# Patient Record
Sex: Female | Born: 1990 | Race: Black or African American | Hispanic: No | Smoking: Former smoker
Health system: Southern US, Community
[De-identification: ages and names within clinical notes are randomized; demographics above are authoritative.]

## PROBLEM LIST (undated history)

## (undated) DIAGNOSIS — B9689 Other specified bacterial agents as the cause of diseases classified elsewhere: Secondary | ICD-10-CM

## (undated) HISTORY — DX: Other specified bacterial agents as the cause of diseases classified elsewhere: B96.89

## (undated) HISTORY — PX: NO PAST SURGERIES: SHX2092

---

## 2018-05-18 NOTE — L&D Delivery Note (Signed)
[facility] Delivery Note  Cassidy Gutierrez is a 28 y.o. now G2P1011 s/p NSVD at [redacted]w[redacted]d who was admitted for IOL for gHTN.   ROM: 17h 19m with clear fluid GBS Status: Negative Maximum Maternal Temperature: 100.7  Delivery Note At 6:08 PM a viable female was delivered via Vaginal, Spontaneous (Presentation: OA).  APGAR: 8, 9; weight 6 lbs 14.4 oz.  Tight Nuchal cord was reduced before delivery of body. Baby was then somersaulted through a  body cord to achieve full delivery. Placenta status: spontaneous, intact via Delena Bali.  Cord: 3VC with no complications.  Cord pH: n/a  Anesthesia: Epidural Episiotomy: None Lacerations: 2nd degree; Sulcus; Vaginal; Perineal Suture Repair: 2.0 vicryl Est. Blood Loss (mL):  172   Postpartum Planning [x]  Mom to postpartum.  Baby to Couplet care / Skin to Skin.  [x]  plans to breast feed [x]  message to sent to schedule follow-up  [x]  vaccines UTD  Laury Deep, MSN, CNM 04/08/19, 6:52 PM

## 2018-09-19 LAB — OB RESULTS CONSOLE HEPATITIS B SURFACE ANTIGEN
Hepatitis B Surface Ag: NEGATIVE
Hepatitis B Surface Ag: NEGATIVE

## 2018-09-19 LAB — OB RESULTS CONSOLE HIV ANTIBODY (ROUTINE TESTING): HIV: NONREACTIVE

## 2018-09-19 LAB — OB RESULTS CONSOLE ABO/RH: RH Type: POSITIVE

## 2018-09-19 LAB — OB RESULTS CONSOLE GC/CHLAMYDIA
Chlamydia: NEGATIVE
Gonorrhea: NEGATIVE

## 2018-09-19 LAB — OB RESULTS CONSOLE RPR
RPR: NONREACTIVE
RPR: NONREACTIVE

## 2018-09-19 LAB — AMB REFERRAL TO OB-GYN: Lead, Blood: <1

## 2018-09-19 LAB — OB RESULTS CONSOLE PLATELET COUNT: Platelets: 194

## 2018-10-27 LAB — AMB REFERRAL TO OB-GYN: Glucose, 1 hour: 113

## 2018-12-16 ENCOUNTER — Telehealth: Payer: Self-pay | Admitting: Obstetrics & Gynecology

## 2018-12-16 NOTE — Telephone Encounter (Signed)
Called the patient to inform of the upcoming appointment, also informed of how to log into the mychart app to attend. Notified the patient if the provider does not log in within 15 minutes of the appointment please call our office so that we can ensure there are no issues with the connection or if the provider is running behind. The patient verbalized understanding.

## 2018-12-19 ENCOUNTER — Telehealth (INDEPENDENT_AMBULATORY_CARE_PROVIDER_SITE_OTHER): Payer: Medicaid Other | Admitting: *Deleted

## 2018-12-19 ENCOUNTER — Encounter: Payer: Self-pay | Admitting: *Deleted

## 2018-12-19 ENCOUNTER — Other Ambulatory Visit: Payer: Self-pay

## 2018-12-19 DIAGNOSIS — Z3490 Encounter for supervision of normal pregnancy, unspecified, unspecified trimester: Secondary | ICD-10-CM

## 2018-12-19 MED ORDER — BLOOD PRESSURE KIT DEVI: 1.0000 | 0 refills | Status: AC | PRN

## 2018-12-19 NOTE — Progress Notes (Signed)
I connected with  [name] on 12/19/18 at  3:30 PM EDT by telephone and verified that I am speaking with the correct person using two identifiers.   I discussed the limitations, risks, security and privacy concerns of performing an evaluation and management service by telephone and the availability of in person appointments. I also discussed with the patient that there may be a patient responsible charge related to this service. The patient expressed understanding and agreed to proceed. We reviewed her EDD based on [place] from her other prenatal provider in [place]. We have some of her labs , some of her  [place] but not provider notes. She states she had 5 [place].  I asked her to come by the office to sign a release to get the rest of the records. I reviewed her new ob appt  Date/ time/ our location/ to wear mask/ no visitors with her. We discussed she will have some visits in office, some virtual. She has already downloaded MyChart app. I sent her babyscripts app and assisted with downloadind. I explained we will send her a bp cuff and once it arrives to take bp weekly and enter in app.  I also explained she will get any remaining labs needed including hemoglobin A1c Nusrat Encarnacion,RN 12/19/2018  3:23 PM

## 2018-12-20 NOTE — Progress Notes (Signed)
Patient seen and assessed by nursing staff during this encounter. I have reviewed the chart and agree with the documentation and plan.  Verita Schneiders, MD 12/20/2018 12:05 PM

## 2018-12-29 ENCOUNTER — Ambulatory Visit (INDEPENDENT_AMBULATORY_CARE_PROVIDER_SITE_OTHER): Payer: Medicaid Other | Admitting: Obstetrics and Gynecology

## 2018-12-29 ENCOUNTER — Encounter: Payer: Self-pay | Admitting: Obstetrics and Gynecology

## 2018-12-29 ENCOUNTER — Other Ambulatory Visit: Payer: Self-pay

## 2018-12-29 VITALS — BP 128/65 | HR 85 | Wt 224.5 lb

## 2018-12-29 DIAGNOSIS — D573 Sickle-cell trait: Secondary | ICD-10-CM

## 2018-12-29 DIAGNOSIS — Z3490 Encounter for supervision of normal pregnancy, unspecified, unspecified trimester: Secondary | ICD-10-CM

## 2018-12-29 DIAGNOSIS — O0930 Supervision of pregnancy with insufficient antenatal care, unspecified trimester: Secondary | ICD-10-CM | POA: Insufficient documentation

## 2018-12-29 DIAGNOSIS — O99212 Obesity complicating pregnancy, second trimester: Secondary | ICD-10-CM | POA: Diagnosis not present

## 2018-12-29 DIAGNOSIS — Z3A23 23 weeks gestation of pregnancy: Secondary | ICD-10-CM | POA: Diagnosis not present

## 2018-12-29 DIAGNOSIS — O99210 Obesity complicating pregnancy, unspecified trimester: Secondary | ICD-10-CM | POA: Insufficient documentation

## 2018-12-29 DIAGNOSIS — O0932 Supervision of pregnancy with insufficient antenatal care, second trimester: Secondary | ICD-10-CM

## 2018-12-29 MED ORDER — ASPIRIN EC 81 MG PO TBEC: 81.0000 mg | DELAYED_RELEASE_TABLET | Freq: Every day | ORAL | 2 refills | Status: DC

## 2018-12-29 NOTE — Progress Notes (Signed)
  Subjective:    Cassidy Gutierrez is a G2P0010 [redacted]w[redacted]d being seen today for her first obstetrical visit in [place]. Patient is transferring care from [place] with some records. Her obstetrical history is significant for maternal obesity, and sickle cell trait. Patient does intend to breast feed. Pregnancy history fully reviewed.  Patient reports no complaints.  Vitals:   12/29/18 1347  BP: 128/65  Pulse: 85  Weight: 224 lb 8 oz (101.8 kg)    HISTORY: OB History  Gravida Para Term Preterm AB Living  2       1    SAB TAB Ectopic Multiple Live Births    1          # Outcome Date GA Lbr Len/2nd Weight Sex Delivery Anes PTL Lv  2 Current           1 TAB 2016           Past Medical History:  Diagnosis Date  . BV (bacterial vaginosis)    No past surgical history on file. Family History  Problem Relation Age of Onset  . Breast cancer Mother   . Diabetes Mother      Exam    Uterus:     Pelvic Exam:    Perineum: Normal Perineum   Vulva: normal   Vagina:  normal mucosa, normal discharge   pH:    Cervix: nulliparous appearance and cervix is closed and long   Adnexa: not evaluated   Bony Pelvis: gynecoid  System: Breast:  normal appearance, no masses or tenderness   Skin: normal coloration and turgor, no rashes    Neurologic: oriented, no focal deficits   Extremities: normal strength, tone, and muscle mass   HEENT extra ocular movement intact   Mouth/Teeth mucous membranes moist, pharynx normal without lesions and dental hygiene good   Neck supple and no masses   Cardiovascular: regular rate and rhythm   Respiratory:  appears well, vitals normal, no respiratory distress, acyanotic, normal RR, chest clear, no wheezing, crepitations, rhonchi, normal symmetric air entry   Abdomen: soft, gravid   Urinary:       Assessment:    Pregnancy: G2P0010 Patient Active Problem List   Diagnosis Date Noted  . Sickle cell trait ([place]) 12/29/2018  . Insufficient prenatal care 12/29/2018  .  Supervision of low-risk pregnancy 12/19/2018        Plan:     Prenatal vitamins. Problem list reviewed and updated. Prenatal labs from [place] reviewed. Will obtain pap smear record  Ultrasound discussed; fetal survey: ordered.  Follow up in 4 weeks for third trimester labs and glucola Rx ASA provided 50% of 30 min visit spent on counseling and coordination of care.     Kipton Skillen 12/29/2018

## 2018-12-29 NOTE — Progress Notes (Signed)
Anatomy US scheduled for August 21st @ 1430.  Pt notified.

## 2018-12-29 NOTE — Patient Instructions (Signed)
 Second Trimester of Pregnancy The second trimester is from week 14 through week 27 (months 4 through 6). The second trimester is often a time when you feel your best. Your body has adjusted to being pregnant, and you begin to feel better physically. Usually, morning sickness has lessened or quit completely, you may have more energy, and you may have an increase in appetite. The second trimester is also a time when the fetus is growing rapidly. At the end of the sixth month, the fetus is about 9 inches long and weighs about 1 pounds. You will likely begin to feel the baby move (quickening) between 16 and 20 weeks of pregnancy. Body changes during your second trimester Your body continues to go through many changes during your second trimester. The changes vary from woman to woman.  Your weight will continue to increase. You will notice your lower abdomen bulging out.  You may begin to get stretch marks on your hips, abdomen, and breasts.  You may develop headaches that can be relieved by medicines. The medicines should be approved by your health care provider.  You may urinate more often because the fetus is pressing on your bladder.  You may develop or continue to have heartburn as a result of your pregnancy.  You may develop constipation because certain hormones are causing the muscles that push waste through your intestines to slow down.  You may develop hemorrhoids or swollen, bulging veins (varicose veins).  You may have back pain. This is caused by: ? Weight gain. ? Pregnancy hormones that are relaxing the joints in your pelvis. ? A shift in weight and the muscles that support your balance.  Your breasts will continue to grow and they will continue to become tender.  Your gums may bleed and may be sensitive to brushing and flossing.  Dark spots or blotches (chloasma, mask of pregnancy) may develop on your face. This will likely fade after the baby is born.  A dark line from  your belly button to the pubic area (linea nigra) may appear. This will likely fade after the baby is born.  You may have changes in your hair. These can include thickening of your hair, rapid growth, and changes in texture. Some women also have hair loss during or after pregnancy, or hair that feels dry or thin. Your hair will most likely return to normal after your baby is born. What to expect at prenatal visits During a routine prenatal visit:  You will be weighed to make sure you and the fetus are growing normally.  Your blood pressure will be taken.  Your abdomen will be measured to track your baby's growth.  The fetal heartbeat will be listened to.  Any test results from the previous visit will be discussed. Your health care provider may ask you:  How you are feeling.  If you are feeling the baby move.  If you have had any abnormal symptoms, such as leaking fluid, bleeding, severe headaches, or abdominal cramping.  If you are using any tobacco products, including cigarettes, chewing tobacco, and electronic cigarettes.  If you have any questions. Other tests that may be performed during your second trimester include:  Blood tests that check for: ? Low iron levels (anemia). ? High blood sugar that affects pregnant women (gestational diabetes) between 24 and 28 weeks. ? Rh antibodies. This is to check for a protein on red blood cells (Rh factor).  Urine tests to check for infections, diabetes, or protein in   the urine.  An ultrasound to confirm the proper growth and development of the baby.  An amniocentesis to check for possible genetic problems.  Fetal screens for spina bifida and Down syndrome.  HIV (human immunodeficiency virus) testing. Routine prenatal testing includes screening for HIV, unless you choose not to have this test. Follow these instructions at home: Medicines  Follow your health care provider's instructions regarding medicine use. Specific medicines  may be either safe or unsafe to take during pregnancy.  Take a prenatal vitamin that contains at least 600 micrograms (mcg) of folic acid.  If you develop constipation, try taking a stool softener if your health care provider approves. Eating and drinking   Eat a balanced diet that includes fresh fruits and vegetables, whole grains, good sources of protein such as meat, eggs, or tofu, and low-fat dairy. Your health care provider will help you determine the amount of weight gain that is right for you.  Avoid raw meat and uncooked cheese. These carry germs that can cause birth defects in the baby.  If you have low calcium intake from food, talk to your health care provider about whether you should take a daily calcium supplement.  Limit foods that are high in fat and processed sugars, such as fried and sweet foods.  To prevent constipation: ? Drink enough fluid to keep your urine clear or pale yellow. ? Eat foods that are high in fiber, such as fresh fruits and vegetables, whole grains, and beans. Activity  Exercise only as directed by your health care provider. Most women can continue their usual exercise routine during pregnancy. Try to exercise for 30 minutes at least 5 days a week. Stop exercising if you experience uterine contractions.  Avoid heavy lifting, wear low heel shoes, and practice good posture.  A sexual relationship may be continued unless your health care provider directs you otherwise. Relieving pain and discomfort  Wear a good support bra to prevent discomfort from breast tenderness.  Take warm sitz baths to soothe any pain or discomfort caused by hemorrhoids. Use hemorrhoid cream if your health care provider approves.  Rest with your legs elevated if you have leg cramps or low back pain.  If you develop varicose veins, wear support hose. Elevate your feet for 15 minutes, 3-4 times a day. Limit salt in your diet. Prenatal Care  Write down your questions. Take  them to your prenatal visits.  Keep all your prenatal visits as told by your health care provider. This is important. Safety  Wear your seat belt at all times when driving.  Make a list of emergency phone numbers, including numbers for family, friends, the hospital, and police and fire departments. General instructions  Ask your health care provider for a referral to a local prenatal education class. Begin classes no later than the beginning of month 6 of your pregnancy.  Ask for help if you have counseling or nutritional needs during pregnancy. Your health care provider can offer advice or refer you to specialists for help with various needs.  Do not use hot tubs, steam rooms, or saunas.  Do not douche or use tampons or scented sanitary pads.  Do not cross your legs for long periods of time.  Avoid cat litter boxes and soil used by cats. These carry germs that can cause birth defects in the baby and possibly loss of the fetus by miscarriage or stillbirth.  Avoid all smoking, herbs, alcohol, and unprescribed drugs. Chemicals in these products can affect the   formation and growth of the baby.  Do not use any products that contain nicotine or tobacco, such as cigarettes and e-cigarettes. If you need help quitting, ask your health care provider.  Visit your dentist if you have not gone yet during your pregnancy. Use a soft toothbrush to brush your teeth and be gentle when you floss. Contact a health care provider if:  You have dizziness.  You have mild pelvic cramps, pelvic pressure, or nagging pain in the abdominal area.  You have persistent nausea, vomiting, or diarrhea.  You have a bad smelling vaginal discharge.  You have pain when you urinate. Get help right away if:  You have a fever.  You are leaking fluid from your vagina.  You have spotting or bleeding from your vagina.  You have severe abdominal cramping or pain.  You have rapid weight gain or weight loss.  You  have shortness of breath with chest pain.  You notice sudden or extreme swelling of your face, hands, ankles, feet, or legs.  You have not felt your baby move in over an hour.  You have severe headaches that do not go away when you take medicine.  You have vision changes. Summary  The second trimester is from week 14 through week 27 (months 4 through 6). It is also a time when the fetus is growing rapidly.  Your body goes through many changes during pregnancy. The changes vary from woman to woman.  Avoid all smoking, herbs, alcohol, and unprescribed drugs. These chemicals affect the formation and growth your baby.  Do not use any tobacco products, such as cigarettes, chewing tobacco, and e-cigarettes. If you need help quitting, ask your health care provider.  Contact your health care provider if you have any questions. Keep all prenatal visits as told by your health care provider. This is important. This information is not intended to replace advice given to you by your health care provider. Make sure you discuss any questions you have with your health care provider. Document Released: 04/28/2001 Document Revised: 08/26/2018 Document Reviewed: 06/09/2016 Elsevier Patient Education  2020 [employer].   Third Trimester of Pregnancy The third trimester is from week 28 through week 40 (months 7 through 9). The third trimester is a time when the unborn baby (fetus) is growing rapidly. At the end of the ninth month, the fetus is about 20 inches in length and weighs 6-10 pounds. Body changes during your third trimester Your body will continue to go through many changes during pregnancy. The changes vary from woman to woman. During the third trimester:  Your weight will continue to increase. You can expect to gain 25-35 pounds (11-16 kg) by the end of the pregnancy.  You may begin to get stretch marks on your hips, abdomen, and breasts.  You may urinate more often because the fetus is  moving lower into your pelvis and pressing on your bladder.  You may develop or continue to have heartburn. This is caused by increased hormones that slow down muscles in the digestive tract.  You may develop or continue to have constipation because increased hormones slow digestion and cause the muscles that push waste through your intestines to relax.  You may develop hemorrhoids. These are swollen veins (varicose veins) in the rectum that can itch or be painful.  You may develop swollen, bulging veins (varicose veins) in your legs.  You may have increased body aches in the pelvis, back, or thighs. This is due to weight gain  and increased hormones that are relaxing your joints.  You may have changes in your hair. These can include thickening of your hair, rapid growth, and changes in texture. Some women also have hair loss during or after pregnancy, or hair that feels dry or thin. Your hair will most likely return to normal after your baby is born.  Your breasts will continue to grow and they will continue to become tender. A yellow fluid (colostrum) may leak from your breasts. This is the first milk you are producing for your baby.  Your belly button may stick out.  You may notice more swelling in your hands, face, or ankles.  You may have increased tingling or numbness in your hands, arms, and legs. The skin on your belly may also feel numb.  You may feel short of breath because of your expanding uterus.  You may have more problems sleeping. This can be caused by the size of your belly, increased need to urinate, and an increase in your body's metabolism.  You may notice the fetus "dropping," or moving lower in your abdomen (lightening).  You may have increased vaginal discharge.  You may notice your joints feel loose and you may have pain around your pelvic bone. What to expect at prenatal visits You will have prenatal exams every 2 weeks until week 36. Then you will have weekly  prenatal exams. During a routine prenatal visit:  You will be weighed to make sure you and the baby are growing normally.  Your blood pressure will be taken.  Your abdomen will be measured to track your baby's growth.  The fetal heartbeat will be listened to.  Any test results from the previous visit will be discussed.  You may have a cervical check near your due date to see if your cervix has softened or thinned (effaced).  You will be tested for Group B streptococcus. This happens between 35 and 37 weeks. Your health care provider may ask you:  What your birth plan is.  How you are feeling.  If you are feeling the baby move.  If you have had any abnormal symptoms, such as leaking fluid, bleeding, severe headaches, or abdominal cramping.  If you are using any tobacco products, including cigarettes, chewing tobacco, and electronic cigarettes.  If you have any questions. Other tests or screenings that may be performed during your third trimester include:  Blood tests that check for low iron levels (anemia).  Fetal testing to check the health, activity level, and growth of the fetus. Testing is done if you have certain medical conditions or if there are problems during the pregnancy.  Nonstress test (NST). This test checks the health of your baby to make sure there are no signs of problems, such as the baby not getting enough oxygen. During this test, a belt is placed around your belly. The baby is made to move, and its heart rate is monitored during movement. What is false labor? False labor is a condition in which you feel small, irregular tightenings of the muscles in the womb (contractions) that usually go away with rest, changing position, or drinking water. These are called Braxton Hicks contractions. Contractions may last for hours, days, or even weeks before true labor sets in. If contractions come at regular intervals, become more frequent, increase in intensity, or become  painful, you should see your health care provider. What are the signs of labor?  Abdominal cramps.  Regular contractions that start at 10 minutes apart and  become stronger and more frequent with time.  Contractions that start on the top of the uterus and spread down to the lower abdomen and back.  Increased pelvic pressure and dull back pain.  A watery or bloody mucus discharge that comes from the vagina.  Leaking of amniotic fluid. This is also known as your "water breaking." It could be a slow trickle or a gush. Let your health care provider know if it has a color or strange odor. If you have any of these signs, call your health care provider right away, even if it is before your due date. Follow these instructions at home: Medicines  Follow your health care provider's instructions regarding medicine use. Specific medicines may be either safe or unsafe to take during pregnancy.  Take a prenatal vitamin that contains at least 600 micrograms (mcg) of folic acid.  If you develop constipation, try taking a stool softener if your health care provider approves. Eating and drinking   Eat a balanced diet that includes fresh fruits and vegetables, whole grains, good sources of protein such as meat, eggs, or tofu, and low-fat dairy. Your health care provider will help you determine the amount of weight gain that is right for you.  Avoid raw meat and uncooked cheese. These carry germs that can cause birth defects in the baby.  If you have low calcium intake from food, talk to your health care provider about whether you should take a daily calcium supplement.  Eat four or five small meals rather than three large meals a day.  Limit foods that are high in fat and processed sugars, such as fried and sweet foods.  To prevent constipation: ? Drink enough fluid to keep your urine clear or pale yellow. ? Eat foods that are high in fiber, such as fresh fruits and vegetables, whole grains, and  beans. Activity  Exercise only as directed by your health care provider. Most women can continue their usual exercise routine during pregnancy. Try to exercise for 30 minutes at least 5 days a week. Stop exercising if you experience uterine contractions.  Avoid heavy lifting.  Do not exercise in extreme heat or humidity, or at high altitudes.  Wear low-heel, comfortable shoes.  Practice good posture.  You may continue to have sex unless your health care provider tells you otherwise. Relieving pain and discomfort  Take frequent breaks and rest with your legs elevated if you have leg cramps or low back pain.  Take warm sitz baths to soothe any pain or discomfort caused by hemorrhoids. Use hemorrhoid cream if your health care provider approves.  Wear a good support bra to prevent discomfort from breast tenderness.  If you develop varicose veins: ? Wear support pantyhose or compression stockings as told by your healthcare provider. ? Elevate your feet for 15 minutes, 3-4 times a day. Prenatal care  Write down your questions. Take them to your prenatal visits.  Keep all your prenatal visits as told by your health care provider. This is important. Safety  Wear your seat belt at all times when driving.  Make a list of emergency phone numbers, including numbers for family, friends, the hospital, and police and fire departments. General instructions  Avoid cat litter boxes and soil used by cats. These carry germs that can cause birth defects in the baby. If you have a cat, ask someone to clean the litter box for you.  Do not travel far distances unless it is absolutely necessary and only with the  approval of your health care provider.  Do not use hot tubs, steam rooms, or saunas.  Do not drink alcohol.  Do not use any products that contain nicotine or tobacco, such as cigarettes and e-cigarettes. If you need help quitting, ask your health care provider.  Do not use any medicinal  herbs or unprescribed drugs. These chemicals affect the formation and growth of the baby.  Do not douche or use tampons or scented sanitary pads.  Do not cross your legs for long periods of time.  To prepare for the arrival of your baby: ? Take prenatal classes to understand, practice, and ask questions about labor and delivery. ? Make a trial run to the hospital. ? Visit the hospital and tour the maternity area. ? Arrange for maternity or paternity leave through employers. ? Arrange for family and friends to take care of pets while you are in the hospital. ? Purchase a rear-facing car seat and make sure you know how to install it in your car. ? Pack your hospital bag. ? Prepare the baby's nursery. Make sure to remove all pillows and stuffed animals from the baby's crib to prevent suffocation.  Visit your dentist if you have not gone during your pregnancy. Use a soft toothbrush to brush your teeth and be gentle when you floss. Contact a health care provider if:  You are unsure if you are in labor or if your water has broken.  You become dizzy.  You have mild pelvic cramps, pelvic pressure, or nagging pain in your abdominal area.  You have lower back pain.  You have persistent nausea, vomiting, or diarrhea.  You have an unusual or bad smelling vaginal discharge.  You have pain when you urinate. Get help right away if:  Your water breaks before 37 weeks.  You have regular contractions less than 5 minutes apart before 37 weeks.  You have a fever.  You are leaking fluid from your vagina.  You have spotting or bleeding from your vagina.  You have severe abdominal pain or cramping.  You have rapid weight loss or weight gain.  You have shortness of breath with chest pain.  You notice sudden or extreme swelling of your face, hands, ankles, feet, or legs.  Your baby makes fewer than 10 movements in 2 hours.  You have severe headaches that do not go away when you take  medicine.  You have vision changes. Summary  The third trimester is from week 28 through week 40, months 7 through 9. The third trimester is a time when the unborn baby (fetus) is growing rapidly.  During the third trimester, your discomfort may increase as you and your baby continue to gain weight. You may have abdominal, leg, and back pain, sleeping problems, and an increased need to urinate.  During the third trimester your breasts will keep growing and they will continue to become tender. A yellow fluid (colostrum) may leak from your breasts. This is the first milk you are producing for your baby.  False labor is a condition in which you feel small, irregular tightenings of the muscles in the womb (contractions) that eventually go away. These are called Braxton Hicks contractions. Contractions may last for hours, days, or even weeks before true labor sets in.  Signs of labor can include: abdominal cramps; regular contractions that start at 10 minutes apart and become stronger and more frequent with time; watery or bloody mucus discharge that comes from the vagina; increased pelvic pressure and dull  back pain; and leaking of amniotic fluid. This information is not intended to replace advice given to you by your health care provider. Make sure you discuss any questions you have with your health care provider. Document Released: 04/28/2001 Document Revised: 08/25/2018 Document Reviewed: 06/09/2016 Elsevier Patient Education  2020 [employer]   Contraception Choices Contraception, also called birth control, refers to methods or devices that prevent pregnancy. Hormonal methods Contraceptive implant  A contraceptive implant is a thin, plastic tube that contains a hormone. It is inserted into the upper part of the arm. It can remain in place for up to 3 years. Progestin-only injections Progestin-only injections are injections of progestin, a synthetic form of the hormone progesterone. They  are given every 3 months by a health care provider. Birth control pills  Birth control pills are pills that contain hormones that prevent pregnancy. They must be taken once a day, preferably at the same time each day. Birth control patch  The birth control patch contains hormones that prevent pregnancy. It is placed on the skin and must be changed once a week for three weeks and removed on the fourth week. A prescription is needed to use this method of contraception. Vaginal ring  A vaginal ring contains hormones that prevent pregnancy. It is placed in the vagina for three weeks and removed on the fourth week. After that, the process is repeated with a new ring. A prescription is needed to use this method of contraception. Emergency contraceptive Emergency contraceptives prevent pregnancy after unprotected sex. They come in pill form and can be taken up to 5 days after sex. They work best the sooner they are taken after having sex. Most emergency contraceptives are available without a prescription. This method should not be used as your only form of birth control. Barrier methods Female condom  A female condom is a thin sheath that is worn over the penis during sex. Condoms keep sperm from going inside a woman's body. They can be used with a spermicide to increase their effectiveness. They should be disposed after a single use. Female condom  A female condom is a soft, loose-fitting sheath that is put into the vagina before sex. The condom keeps sperm from going inside a woman's body. They should be disposed after a single use. Diaphragm  A diaphragm is a soft, dome-shaped barrier. It is inserted into the vagina before sex, along with a spermicide. The diaphragm blocks sperm from entering the uterus, and the spermicide kills sperm. A diaphragm should be left in the vagina for 6-8 hours after sex and removed within 24 hours. A diaphragm is prescribed and fitted by a health care provider. A diaphragm  should be replaced every 1-2 years, after giving birth, after gaining more than 15 lb (6.8 kg), and after pelvic surgery. Cervical cap  A cervical cap is a round, soft latex or plastic cup that fits over the cervix. It is inserted into the vagina before sex, along with spermicide. It blocks sperm from entering the uterus. The cap should be left in place for 6-8 hours after sex and removed within 48 hours. A cervical cap must be prescribed and fitted by a health care provider. It should be replaced every 2 years. Sponge  A sponge is a soft, circular piece of polyurethane foam with spermicide on it. The sponge helps block sperm from entering the uterus, and the spermicide kills sperm. To use it, you make it wet and then insert it into the vagina. It  should be inserted before sex, left in for at least 6 hours after sex, and removed and thrown away within 30 hours. Spermicides Spermicides are chemicals that kill or block sperm from entering the cervix and uterus. They can come as a cream, jelly, suppository, foam, or tablet. A spermicide should be inserted into the vagina with an applicator at least 10-15 minutes before sex to allow time for it to work. The process must be repeated every time you have sex. Spermicides do not require a prescription. Intrauterine contraception Intrauterine device (IUD) An IUD is a T-shaped device that is put in a woman's uterus. There are two types:  Hormone IUD.This type contains progestin, a synthetic form of the hormone progesterone. This type can stay in place for 3-5 years.  Copper IUD.This type is wrapped in copper wire. It can stay in place for 10 years.  Permanent methods of contraception Female tubal ligation In this method, a woman's fallopian tubes are sealed, tied, or blocked during surgery to prevent eggs from traveling to the uterus. Hysteroscopic sterilization In this method, a small, flexible insert is placed into each fallopian tube. The inserts cause  scar tissue to form in the fallopian tubes and block them, so sperm cannot reach an egg. The procedure takes about 3 months to be effective. Another form of birth control must be used during those 3 months. Female sterilization This is a procedure to tie off the tubes that carry sperm (vasectomy). After the procedure, the man can still ejaculate fluid (semen). Natural planning methods Natural family planning In this method, a couple does not have sex on days when the woman could become pregnant. Calendar method This means keeping track of the length of each menstrual cycle, identifying the days when pregnancy can happen, and not having sex on those days. Ovulation method In this method, a couple avoids sex during ovulation. Symptothermal method This method involves not having sex during ovulation. The woman typically checks for ovulation by watching changes in her temperature and in the consistency of cervical mucus. Post-ovulation method In this method, a couple waits to have sex until after ovulation. Summary  Contraception, also called birth control, means methods or devices that prevent pregnancy.  Hormonal methods of contraception include implants, injections, pills, patches, vaginal rings, and emergency contraceptives.  Barrier methods of contraception can include female condoms, female condoms, diaphragms, cervical caps, sponges, and spermicides.  There are two types of IUDs (intrauterine devices). An IUD can be put in a woman's uterus to prevent pregnancy for 3-5 years.  Permanent sterilization can be done through a procedure for males, females, or both.  Natural family planning methods involve not having sex on days when the woman could become pregnant. This information is not intended to replace advice given to you by your health care provider. Make sure you discuss any questions you have with your health care provider. Document Released: 05/04/2005 Document Revised: 05/06/2017  Document Reviewed: 06/06/2016 Elsevier Patient Education  2020 [employer].   Breastfeeding  Choosing to breastfeed is one of the best decisions you can make for yourself and your baby. A change in hormones during pregnancy causes your breasts to make breast milk in your milk-producing glands. Hormones prevent breast milk from being released before your baby is born. They also prompt milk flow after birth. Once breastfeeding has begun, thoughts of your baby, as well as his or her sucking or crying, can stimulate the release of milk from your milk-producing glands. Benefits of breastfeeding Research  shows that breastfeeding offers many health benefits for infants and mothers. It also offers a cost-free and convenient way to feed your baby. For your baby  Your first milk (colostrum) helps your baby's digestive system to function better.  Special cells in your milk (antibodies) help your baby to fight off infections.  Breastfed babies are less likely to develop asthma, allergies, obesity, or type 2 diabetes. They are also at lower risk for sudden infant death syndrome (SIDS).  Nutrients in breast milk are better able to meet your baby's needs compared to infant formula.  Breast milk improves your baby's brain development. For you  Breastfeeding helps to create a very special bond between you and your baby.  Breastfeeding is convenient. Breast milk costs nothing and is always available at the correct temperature.  Breastfeeding helps to burn calories. It helps you to lose the weight that you gained during pregnancy.  Breastfeeding makes your uterus return faster to its size before pregnancy. It also slows bleeding (lochia) after you give birth.  Breastfeeding helps to lower your risk of developing type 2 diabetes, osteoporosis, rheumatoid arthritis, cardiovascular disease, and breast, ovarian, uterine, and endometrial cancer later in life. Breastfeeding basics Starting breastfeeding   Find a comfortable place to sit or lie down, with your neck and back well-supported.  Place a pillow or a rolled-up blanket under your baby to bring him or her to the level of your breast (if you are seated). Nursing pillows are specially designed to help support your arms and your baby while you breastfeed.  Make sure that your baby's tummy (abdomen) is facing your abdomen.  Gently massage your breast. With your fingertips, massage from the outer edges of your breast inward toward the nipple. This encourages milk flow. If your milk flows slowly, you may need to continue this action during the feeding.  Support your breast with 4 fingers underneath and your thumb above your nipple (make the letter "C" with your hand). Make sure your fingers are well away from your nipple and your baby's mouth.  Stroke your baby's lips gently with your finger or nipple.  When your baby's mouth is open wide enough, quickly bring your baby to your breast, placing your entire nipple and as much of the areola as possible into your baby's mouth. The areola is the colored area around your nipple. ? More areola should be visible above your baby's upper lip than below the lower lip. ? Your baby's lips should be opened and extended outward (flanged) to ensure an adequate, comfortable latch. ? Your baby's tongue should be between his or her lower gum and your breast.  Make sure that your baby's mouth is correctly positioned around your nipple (latched). Your baby's lips should create a seal on your breast and be turned out (everted).  It is common for your baby to suck about 2-3 minutes in order to start the flow of breast milk. Latching Teaching your baby how to latch onto your breast properly is very important. An improper latch can cause nipple pain, decreased milk supply, and poor weight gain in your baby. Also, if your baby is not latched onto your nipple properly, he or she may swallow some air during feeding. This  can make your baby fussy. Burping your baby when you switch breasts during the feeding can help to get rid of the air. However, teaching your baby to latch on properly is still the best way to prevent fussiness from swallowing air while breastfeeding. Signs  that your baby has successfully latched onto your nipple  Silent tugging or silent sucking, without causing you pain. Infant's lips should be extended outward (flanged).  Swallowing heard between every 3-4 sucks once your milk has started to flow (after your let-down milk reflex occurs).  Muscle movement above and in front of his or her ears while sucking. Signs that your baby has not successfully latched onto your nipple  Sucking sounds or smacking sounds from your baby while breastfeeding.  Nipple pain. If you think your baby has not latched on correctly, slip your finger into the corner of your baby's mouth to break the suction and place it between your baby's gums. Attempt to start breastfeeding again. Signs of successful breastfeeding Signs from your baby  Your baby will gradually decrease the number of sucks or will completely stop sucking.  Your baby will fall asleep.  Your baby's body will relax.  Your baby will retain a small amount of milk in his or her mouth.  Your baby will let go of your breast by himself or herself. Signs from you  Breasts that have increased in firmness, weight, and size 1-3 hours after feeding.  Breasts that are softer immediately after breastfeeding.  Increased milk volume, as well as a change in milk consistency and color by the fifth day of breastfeeding.  Nipples that are not sore, cracked, or bleeding. Signs that your baby is getting enough milk  Wetting at least 1-2 diapers during the first 24 hours after birth.  Wetting at least 5-6 diapers every 24 hours for the first week after birth. The urine should be clear or pale yellow by the age of 5 days.  Wetting 6-8 diapers every 24 hours  as your baby continues to grow and develop.  At least 3 stools in a 24-hour period by the age of 5 days. The stool should be soft and yellow.  At least 3 stools in a 24-hour period by the age of 7 days. The stool should be seedy and yellow.  No loss of weight greater than 10% of birth weight during the first 3 days of life.  Average weight gain of 4-7 oz (113-198 g) per week after the age of 4 days.  Consistent daily weight gain by the age of 5 days, without weight loss after the age of 2 weeks. After a feeding, your baby may spit up a small amount of milk. This is normal. Breastfeeding frequency and duration Frequent feeding will help you make more milk and can prevent sore nipples and extremely full breasts (breast engorgement). Breastfeed when you feel the need to reduce the fullness of your breasts or when your baby shows signs of hunger. This is called "breastfeeding on demand." Signs that your baby is hungry include:  Increased alertness, activity, or restlessness.  Movement of the head from side to side.  Opening of the mouth when the corner of the mouth or cheek is stroked (rooting).  Increased sucking sounds, smacking lips, cooing, sighing, or squeaking.  Hand-to-mouth movements and sucking on fingers or hands.  Fussing or crying. Avoid introducing a pacifier to your baby in the first 4-6 weeks after your baby is born. After this time, you may choose to use a pacifier. Research has shown that pacifier use during the first year of a baby's life decreases the risk of sudden infant death syndrome (SIDS). Allow your baby to feed on each breast as long as he or she wants. When your baby unlatches or  falls asleep while feeding from the first breast, offer the second breast. Because newborns are often sleepy in the first few weeks of life, you may need to awaken your baby to get him or her to feed. Breastfeeding times will vary from baby to baby. However, the following rules can serve  as a guide to help you make sure that your baby is properly fed:  Newborns (babies 109 weeks of age or younger) may breastfeed every 1-3 hours.  Newborns should not go without breastfeeding for longer than 3 hours during the day or 5 hours during the night.  You should breastfeed your baby a minimum of 8 times in a 24-hour period. Breast milk pumping     Pumping and storing breast milk allows you to make sure that your baby is exclusively fed your breast milk, even at times when you are unable to breastfeed. This is especially important if you go back to work while you are still breastfeeding, or if you are not able to be present during feedings. Your lactation consultant can help you find a method of pumping that works best for you and give you guidelines about how long it is safe to store breast milk. Caring for your breasts while you breastfeed Nipples can become dry, cracked, and sore while breastfeeding. The following recommendations can help keep your breasts moisturized and healthy:  Avoid using soap on your nipples.  Wear a supportive bra designed especially for nursing. Avoid wearing underwire-style bras or extremely tight bras (sports bras).  Air-dry your nipples for 3-4 minutes after each feeding.  Use only cotton bra pads to absorb leaked breast milk. Leaking of breast milk between feedings is normal.  Use lanolin on your nipples after breastfeeding. Lanolin helps to maintain your skin's normal moisture barrier. Pure lanolin is not harmful (not toxic) to your baby. You may also hand express a few drops of breast milk and gently massage that milk into your nipples and allow the milk to air-dry. In the first few weeks after giving birth, some women experience breast engorgement. Engorgement can make your breasts feel heavy, warm, and tender to the touch. Engorgement peaks within 3-5 days after you give birth. The following recommendations can help to ease engorgement:  Completely  empty your breasts while breastfeeding or pumping. You may want to start by applying warm, moist heat (in the shower or with warm, water-soaked hand towels) just before feeding or pumping. This increases circulation and helps the milk flow. If your baby does not completely empty your breasts while breastfeeding, pump any extra milk after he or she is finished.  Apply ice packs to your breasts immediately after breastfeeding or pumping, unless this is too uncomfortable for you. To do this: ? Put ice in a plastic bag. ? Place a towel between your skin and the bag. ? Leave the ice on for 20 minutes, 2-3 times a day.  Make sure that your baby is latched on and positioned properly while breastfeeding. If engorgement persists after 48 hours of following these recommendations, contact your health care provider or a Advertising copywriter. Overall health care recommendations while breastfeeding  Eat 3 healthy meals and 3 snacks every day. Well-nourished mothers who are breastfeeding need an additional 450-500 calories a day. You can meet this requirement by increasing the amount of a balanced diet that you eat.  Drink enough water to keep your urine pale yellow or clear.  Rest often, relax, and continue to take your prenatal vitamins to  prevent fatigue, stress, and low vitamin and mineral levels in your body (nutrient deficiencies).  Do not use any products that contain nicotine or tobacco, such as cigarettes and e-cigarettes. Your baby may be harmed by chemicals from cigarettes that pass into breast milk and exposure to secondhand smoke. If you need help quitting, ask your health care provider.  Avoid alcohol.  Do not use illegal drugs or marijuana.  Talk with your health care provider before taking any medicines. These include over-the-counter and prescription medicines as well as vitamins and herbal supplements. Some medicines that may be harmful to your baby can pass through breast milk.  It is  possible to become pregnant while breastfeeding. If birth control is desired, ask your health care provider about options that will be safe while breastfeeding your baby. Where to find more information: [employer]: www.llli.org Contact a health care provider if:  You feel like you want to stop breastfeeding or have become frustrated with breastfeeding.  Your nipples are cracked or bleeding.  Your breasts are red, tender, or warm.  You have: ? Painful breasts or nipples. ? A swollen area on either breast. ? A fever or chills. ? Nausea or vomiting. ? Drainage other than breast milk from your nipples.  Your breasts do not become full before feedings by the fifth day after you give birth.  You feel sad and depressed.  Your baby is: ? Too sleepy to eat well. ? Having trouble sleeping. ? More than 34 week old and wetting fewer than 6 diapers in a 24-hour period. ? Not gaining weight by 40 days of age.  Your baby has fewer than 3 stools in a 24-hour period.  Your baby's skin or the white parts of his or her eyes become yellow. Get help right away if:  Your baby is overly tired (lethargic) and does not want to wake up and feed.  Your baby develops an unexplained fever. Summary  Breastfeeding offers many health benefits for infant and mothers.  Try to breastfeed your infant when he or she shows early signs of hunger.  Gently tickle or stroke your baby's lips with your finger or nipple to allow the baby to open his or her mouth. Bring the baby to your breast. Make sure that much of the areola is in your baby's mouth. Offer one side and burp the baby before you offer the other side.  Talk with your health care provider or lactation consultant if you have questions or you face problems as you breastfeed. This information is not intended to replace advice given to you by your health care provider. Make sure you discuss any questions you have with your health care  provider. Document Released: 05/04/2005 Document Revised: 07/29/2017 Document Reviewed: 06/05/2016 Elsevier Patient Education  2020 [employer].

## 2019-01-05 ENCOUNTER — Telehealth: Payer: Self-pay | Admitting: Medical

## 2019-01-05 NOTE — Telephone Encounter (Signed)
Called patient to inform her of appointment change. Left a voice message for her to call the office.

## 2019-01-06 ENCOUNTER — Other Ambulatory Visit (HOSPITAL_COMMUNITY): Payer: Self-pay | Admitting: *Deleted

## 2019-01-06 ENCOUNTER — Ambulatory Visit (HOSPITAL_COMMUNITY)
Admission: RE | Admit: 2019-01-06 | Discharge: 2019-01-06 | Disposition: A | Discharge status: Home / Self Care | Payer: Medicaid Other | Source: Ambulatory Visit | Attending: Obstetrics and Gynecology | Admitting: Obstetrics and Gynecology

## 2019-01-06 ENCOUNTER — Other Ambulatory Visit: Payer: Self-pay

## 2019-01-06 DIAGNOSIS — O0930 Supervision of pregnancy with insufficient antenatal care, unspecified trimester: Secondary | ICD-10-CM | POA: Diagnosis not present

## 2019-01-06 DIAGNOSIS — Z363 Encounter for antenatal screening for malformations: Secondary | ICD-10-CM | POA: Diagnosis not present

## 2019-01-06 DIAGNOSIS — Z3A24 24 weeks gestation of pregnancy: Secondary | ICD-10-CM | POA: Diagnosis not present

## 2019-01-06 DIAGNOSIS — Z3490 Encounter for supervision of normal pregnancy, unspecified, unspecified trimester: Secondary | ICD-10-CM | POA: Insufficient documentation

## 2019-01-06 DIAGNOSIS — Z362 Encounter for other antenatal screening follow-up: Secondary | ICD-10-CM

## 2019-01-06 DIAGNOSIS — Z862 Personal history of diseases of the blood and blood-forming organs and certain disorders involving the immune mechanism: Secondary | ICD-10-CM | POA: Diagnosis not present

## 2019-01-12 ENCOUNTER — Telehealth: Payer: Self-pay | Admitting: Lactation Services

## 2019-01-12 NOTE — Telephone Encounter (Signed)
Received [facility]s alert for elevated BP of 151/90. Attempted to call pt to assess for symptoms. LM for pt to call the office or answer MyChart message that is sent.

## 2019-01-13 ENCOUNTER — Telehealth: Payer: Self-pay | Admitting: *Deleted

## 2019-01-13 NOTE — Telephone Encounter (Signed)
See phone call re: this issue on 01/13/19

## 2019-01-13 NOTE — Telephone Encounter (Signed)
Received a Babyscripts alert phone call that [place] BP was  146/84 . I called [place] and heard message mailbox is full.  Will send mychart message. Discussed with Dr. Rosana Hoes and plan is for patient to come in for bp check next week with her bp cuff so we can check.  Will have registrar schedule and notify pt.  Linda,RN

## 2019-01-26 ENCOUNTER — Encounter: Payer: Medicaid Other | Admitting: Obstetrics and Gynecology

## 2019-01-26 ENCOUNTER — Other Ambulatory Visit: Payer: Medicaid Other

## 2019-01-27 ENCOUNTER — Encounter: Payer: Self-pay | Admitting: *Deleted

## 2019-01-31 ENCOUNTER — Other Ambulatory Visit: Payer: Self-pay | Admitting: *Deleted

## 2019-01-31 DIAGNOSIS — Z3490 Encounter for supervision of normal pregnancy, unspecified, unspecified trimester: Secondary | ICD-10-CM

## 2019-01-31 DIAGNOSIS — O99210 Obesity complicating pregnancy, unspecified trimester: Secondary | ICD-10-CM

## 2019-02-03 ENCOUNTER — Ambulatory Visit (HOSPITAL_COMMUNITY): Payer: Medicaid Other

## 2019-02-03 ENCOUNTER — Ambulatory Visit (HOSPITAL_COMMUNITY)
Admission: RE | Admit: 2019-02-03 | Discharge: 2019-02-03 | Disposition: A | Discharge status: Home / Self Care | Payer: Medicaid Other | Source: Ambulatory Visit | Attending: Obstetrics and Gynecology | Admitting: Obstetrics and Gynecology

## 2019-02-03 ENCOUNTER — Other Ambulatory Visit: Payer: Self-pay

## 2019-02-03 DIAGNOSIS — Z862 Personal history of diseases of the blood and blood-forming organs and certain disorders involving the immune mechanism: Secondary | ICD-10-CM

## 2019-02-03 DIAGNOSIS — Z362 Encounter for other antenatal screening follow-up: Secondary | ICD-10-CM

## 2019-02-03 DIAGNOSIS — O99213 Obesity complicating pregnancy, third trimester: Secondary | ICD-10-CM | POA: Diagnosis not present

## 2019-02-03 DIAGNOSIS — O0933 Supervision of pregnancy with insufficient antenatal care, third trimester: Secondary | ICD-10-CM

## 2019-02-03 DIAGNOSIS — Z3A28 28 weeks gestation of pregnancy: Secondary | ICD-10-CM

## 2019-02-06 ENCOUNTER — Telehealth: Payer: Self-pay | Admitting: Advanced Practice Midwife

## 2019-02-06 ENCOUNTER — Encounter: Payer: Medicaid Other | Admitting: Advanced Practice Midwife

## 2019-02-06 ENCOUNTER — Encounter: Payer: Self-pay | Admitting: Student

## 2019-02-06 ENCOUNTER — Encounter: Payer: Self-pay | Admitting: Advanced Practice Midwife

## 2019-02-06 ENCOUNTER — Other Ambulatory Visit: Payer: Medicaid Other

## 2019-02-06 DIAGNOSIS — O2341 Unspecified infection of urinary tract in pregnancy, first trimester: Secondary | ICD-10-CM | POA: Insufficient documentation

## 2019-02-06 NOTE — Telephone Encounter (Signed)
Attempted to call patient about her missed ob appointment on 9/21. No answer, left voicemail for patient to give the office a call back. No show letter mailed.

## 2019-02-07 ENCOUNTER — Ambulatory Visit (INDEPENDENT_AMBULATORY_CARE_PROVIDER_SITE_OTHER): Payer: Medicaid Other | Admitting: Student

## 2019-02-07 ENCOUNTER — Other Ambulatory Visit: Payer: Medicaid Other

## 2019-02-07 ENCOUNTER — Other Ambulatory Visit: Payer: Self-pay

## 2019-02-07 VITALS — BP 126/73 | HR 101 | Temp 98.3°F | Wt 232.6 lb

## 2019-02-07 DIAGNOSIS — R030 Elevated blood-pressure reading, without diagnosis of hypertension: Secondary | ICD-10-CM

## 2019-02-07 DIAGNOSIS — O26893 Other specified pregnancy related conditions, third trimester: Secondary | ICD-10-CM

## 2019-02-07 DIAGNOSIS — O99210 Obesity complicating pregnancy, unspecified trimester: Secondary | ICD-10-CM

## 2019-02-07 DIAGNOSIS — Z3A29 29 weeks gestation of pregnancy: Secondary | ICD-10-CM

## 2019-02-07 DIAGNOSIS — Z3493 Encounter for supervision of normal pregnancy, unspecified, third trimester: Secondary | ICD-10-CM

## 2019-02-07 DIAGNOSIS — Z3490 Encounter for supervision of normal pregnancy, unspecified, unspecified trimester: Secondary | ICD-10-CM

## 2019-02-07 NOTE — Progress Notes (Signed)
   PRENATAL VISIT NOTE  Subjective:  Cassidy Gutierrez is a 28 y.o. G2P0010 at 12w2dbeing seen today for ongoing prenatal care.  She is currently monitored for the following issues for this low-risk pregnancy and has Supervision of low-risk pregnancy; Sickle cell trait ([place]; Insufficient prenatal care; Maternal obesity affecting pregnancy, antepartum; and UTI (urinary tract infection) during pregnancy, first trimester on their problem list.  Patient reports no complaints.  Contractions: Not present. Vag. Bleeding: None.  Movement: Present. Denies leaking of fluid.   The following portions of the patient's history were reviewed and updated as appropriate: allergies, current medications, past family history, past medical history, past social history, past surgical history and problem list.   Objective:   Vitals:   02/07/19 0909  BP: 126/73  Pulse: (!) 101  Temp: 98.3 F (36.8 C)  Weight: 232 lb 9.6 oz (105.5 kg)    Fetal Status: Fetal Heart Rate (bpm): 155 Fundal Height: 30 cm Movement: Present     General:  Alert, oriented and cooperative. Patient is in no acute distress.  Skin: Skin is warm and dry. No rash noted.   Cardiovascular: Normal heart rate noted  Respiratory: Normal respiratory effort, no problems with respiration noted  Abdomen: Soft, gravid, appropriate for gestational age.  Pain/Pressure: Absent     Pelvic: Cervical exam deferred        Extremities: Normal range of motion.  Edema: None  Mental Status: Normal mood and affect. Normal behavior. Normal judgment and thought content.   Assessment and Plan:  Pregnancy: G2P0010 at 260w2d. Encounter for supervision of low-risk pregnancy in third trimester -doing well  2. Elevated BP without diagnosis of hypertension -had some elevated BPs with home cuff. In office BPs are normal. Denies history of hypertension. Will get baseline labs today & have patient return for BP check, will bring her home cuff to the office - Culture,  OB Urine - Comp Met (CMET) - Protein / creatinine ratio, urine  Preterm labor symptoms and general obstetric precautions including but not limited to vaginal bleeding, contractions, leaking of fluid and fetal movement were reviewed in detail with the patient. Please refer to After Visit Summary for other counseling recommendations.   Return in about 1 week (around 02/14/2019) for BP check with RN, pt to bring her home cuff, then in person visit in 3 weeks.  Future Appointments  Date Time Provider De[place]9/29/2020  3:00 PM [place]/13/2020  3:15 PM [facility]    ErJorje GuildNP

## 2019-02-07 NOTE — Progress Notes (Signed)
Pt has been checking BP's but not always once per week. Pt reports occasional mild headaches which is common for her. She denies visual disturbances. She has not obtained ASA 81 mg yet. Pt having 2hr GTT today. Tdap offered - pt declined for today - will consider @ next visit.

## 2019-02-07 NOTE — Patient Instructions (Addendum)
Contraception Choices Contraception, also called birth control, refers to methods or devices that prevent pregnancy. Hormonal methods Contraceptive implant  A contraceptive implant is a thin, plastic tube that contains a hormone. It is inserted into the upper part of the arm. It can remain in place for up to 3 years. Progestin-only injections Progestin-only injections are injections of progestin, a synthetic form of the hormone progesterone. They are given every 3 months by a health care provider. Birth control pills  Birth control pills are pills that contain hormones that prevent pregnancy. They must be taken once a day, preferably at the same time each day. Birth control patch  The birth control patch contains hormones that prevent pregnancy. It is placed on the skin and must be changed once a week for three weeks and removed on the fourth week. A prescription is needed to use this method of contraception. Vaginal ring  A vaginal ring contains hormones that prevent pregnancy. It is placed in the vagina for three weeks and removed on the fourth week. After that, the process is repeated with a new ring. A prescription is needed to use this method of contraception. Emergency contraceptive Emergency contraceptives prevent pregnancy after unprotected sex. They come in pill form and can be taken up to 5 days after sex. They work best the sooner they are taken after having sex. Most emergency contraceptives are available without a prescription. This method should not be used as your only form of birth control. Barrier methods Female condom  A female condom is a thin sheath that is worn over the penis during sex. Condoms keep sperm from going inside a woman's body. They can be used with a spermicide to increase their effectiveness. They should be disposed after a single use. Female condom  A female condom is a soft, loose-fitting sheath that is put into the vagina before sex. The condom  keeps sperm from going inside a woman's body. They should be disposed after a single use. Diaphragm  A diaphragm is a soft, dome-shaped barrier. It is inserted into the vagina before sex, along with a spermicide. The diaphragm blocks sperm from entering the uterus, and the spermicide kills sperm. A diaphragm should be left in the vagina for 6-8 hours after sex and removed within 24 hours. A diaphragm is prescribed and fitted by a health care provider. A diaphragm should be replaced every 1-2 years, after giving birth, after gaining more than 15 lb (6.8 kg), and after pelvic surgery. Cervical cap  A cervical cap is a round, soft latex or plastic cup that fits over the cervix. It is inserted into the vagina before sex, along with spermicide. It blocks sperm from entering the uterus. The cap should be left in place for 6-8 hours after sex and removed within 48 hours. A cervical cap must be prescribed and fitted by a health care provider. It should be replaced every 2 years. Sponge  A sponge is a soft, circular piece of polyurethane foam with spermicide on it. The sponge helps block sperm from entering the uterus, and the spermicide kills sperm. To use it, you make it wet and then insert it into the vagina. It should be inserted before sex, left in for at least 6 hours after sex, and removed and thrown away within 30 hours. Spermicides Spermicides are chemicals that kill or block sperm from entering the cervix and uterus. They can come as a cream, jelly, suppository, foam, or tablet. A spermicide should  be inserted into the vagina with an applicator at least 86-76 minutes before sex to allow time for it to work. The process must be repeated every time you have sex. Spermicides do not require a prescription. Intrauterine contraception Intrauterine device (IUD) An IUD is a T-shaped device that is put in a woman's uterus. There are two types:  Hormone IUD.This type contains progestin, a synthetic form of  the hormone progesterone. This type can stay in place for 3-5 years.  Copper IUD.This type is wrapped in copper wire. It can stay in place for 10 years.  Permanent methods of contraception Female tubal ligation In this method, a woman's fallopian tubes are sealed, tied, or blocked during surgery to prevent eggs from traveling to the uterus. Hysteroscopic sterilization In this method, a small, flexible insert is placed into each fallopian tube. The inserts cause scar tissue to form in the fallopian tubes and block them, so sperm cannot reach an egg. The procedure takes about 3 months to be effective. Another form of birth control must be used during those 3 months. Female sterilization This is a procedure to tie off the tubes that carry sperm (vasectomy). After the procedure, the man can still ejaculate fluid (semen). Natural planning methods Natural family planning In this method, a couple does not have sex on days when the woman could become pregnant. Calendar method This means keeping track of the length of each menstrual cycle, identifying the days when pregnancy can happen, and not having sex on those days. Ovulation method In this method, a couple avoids sex during ovulation. Symptothermal method This method involves not having sex during ovulation. The woman typically checks for ovulation by watching changes in her temperature and in the consistency of cervical mucus. Post-ovulation method In this method, a couple waits to have sex until after ovulation. Summary  Contraception, also called birth control, means methods or devices that prevent pregnancy.  Hormonal methods of contraception include implants, injections, pills, patches, vaginal rings, and emergency contraceptives.  Barrier methods of contraception can include female condoms, female condoms, diaphragms, cervical caps, sponges, and spermicides.  There are two types of IUDs (intrauterine devices). An IUD can be put in a  woman's uterus to prevent pregnancy for 3-5 years.  Permanent sterilization can be done through a procedure for males, females, or both.  Natural family planning methods involve not having sex on days when the woman could become pregnant. This information is not intended to replace advice given to you by your health care provider. Make sure you discuss any questions you have with your health care provider. Document Released: 05/04/2005 Document Revised: 05/06/2017 Document Reviewed: 06/06/2016 Elsevier Patient Education  2020 [place] to have your son circumcised:                                                                      [facility]     [phone]   [phone] while you are in hospital         [facility]              [phone]   [phone] by 4 wks  [facility]                     [phone]   $269 by 7 days [facility]                    [phone]   $269 by 4 wks [facility]             [phone]   $225 by 2 wks    These prices sometimes change but are roughly what you can expect to pay. Please call and confirm pricing.   Circumcision is considered an elective/non-medically necessary procedure. There are many reasons parents decide to have their sons circumsized. During the first year of life circumcised males have a reduced risk of urinary tract infections but after this year the rates between circumcised males and uncircumcised males are the same.  It is safe to have your son circumcised outside of the hospital and the places above perform them regularly.   Deciding about Circumcision in Baby Boys  (Up-to-date The Basics)  What is circumcision?   Circumcision is a surgery that removes the skin that covers the tip of the penis, called the "foreskin" Circumcision is usually done when a boy is between 561 and 5410 days old. In the [place], circumcision is common. In some other countries, fewer boys are circumcised. Circumcision is a common tradition in some  religions.  Should I have my baby boy circumcised?   There is no easy answer. Circumcision has some benefits. But it also has risks. After talking with your doctor, you will have to decide for yourself what is right for your family.  What are the benefits of circumcision?   Circumcised boys seem to have slightly lower rates of: ?Urinary tract infections ?Swelling of the opening at the tip of the penis Circumcised men seem to have slightly lower rates of: ?Urinary tract infections ?Swelling of the opening at the tip of the penis ?Penis cancer ?HIV and other infections that you catch during sex ?Cervical cancer in the women they have sex with Even so, in the [place], the risks of these problems are small - even in boys and men who have not been circumcised. Plus, boys and men who are not circumcised can reduce these extra risks by: ?Cleaning their penis well ?Using condoms during sex  What are the risks of circumcision?  Risks include: ?Bleeding or infection from the surgery ?Damage to or amputation of the penis ?A chance that the doctor will cut off too much or not enough of the foreskin ?A chance that sex won't feel as good later in life Only about 1 out of every 200 circumcisions leads to problems. There is also a chance that your health insurance won't pay for circumcision.  How is circumcision done in baby boys?  First, the baby gets medicine for pain relief. This might be a cream on the skin or a shot into the base of the penis. Next, the doctor cleans the baby's penis well. Then he or she uses special tools to cut off the foreskin. Finally, the doctor wraps a bandage (called gauze) around the baby's penis. If you have your baby circumcised, his doctor or nurse will give you instructions on how to care for him after the surgery. It is important that you follow those instructions carefully.    AREA PEDIATRIC/FAMILY PRACTICE PHYSICIANS  [place]  [phone]) . [facility] Melodie Bouillono Chambliss, MD; Lum BabeEniola, MD; Sheffield SliderHale, MD; CarlinvilleHensel,  MD; McDiarmid, MD; Jerene Bears, MD; Jennette Kettle, MD; Gwendolyn Grant, MD o [address] [place] [place], [place] [address] o [phone] o Mon-Fri 8:30-12:30, 1:30-5:00 o Providers come to see babies at [facility] o Accepting Medicaid . [facility] o Limited providers who accept newborns: Docia Chuck, MD; Kateri Plummer, MD; Paulino Rily, MD o [address], [place], [place] [address] o [phone] o Mon-Fri 8:00-5:30 o Babies seen by providers at [facility] o Does NOT accept Medicaid o Please call early in hospitalization for appointment (limited availability)  . [facility] Fatima Sanger, MD o [address], [place], [place] [address] o [phone] o Mon, Tue, Thur, Fri 8:30-5:00, Wed 10:00-7:00 (closed 1-2pm) o Babies seen by [facility] providers o Accepting Medicaid . Donnie Coffin - Pediatrician Fae Pippin, MD o [address], [place], [place] [address] o [phone] o Mon-Fri 8:30-5:00, Sat 8:30-12:00 o Provider comes to see babies at [facility] o Accepting Medicaid o Must have been referred from current patients or contacted office prior to delivery . [facility]) Leotis Pain, MD; Ave Filter, MD; Luna Fuse, MD; Kennedy Bucker, MD; Konrad Dolores, MD; Kathlene November, MD; Jenne Campus, MD; Lubertha South, MD; Wynetta Emery, MD; Duffy Rhody, MD; Gerre Couch, NP; Shirl Harris, NP o [address] [place], [place], [place] [address] o [phone] o Mon, Tue, Thur, Fri 8:30-5:30, Wed 9:30-5:30, Sat 8:30-12:30 o Babies seen by [facility] providers o Accepting Medicaid o Only accepting infants of first-time parents or siblings of current patients [facility] discharge coordinator will make follow-up appointment . Cyril Mourning o [address], [place], [place]  [address] o [phone]   Fax - [phone] . [facility]  o [address], [place], [place]  [address] o Phone - [phone]   Fax - [phone] . Lucio Edward o [address], [place], [place]  [address] o [phone]  [place] [phone]) . [facility] Jorge Mandril, MD; Alita Chyle, MD; Princella Ion, MD; MD; Earlene Plater, MD; Jamesetta Orleans, MD; Alvera Novel, MD; Clarene Duke, MD; Rana Snare, MD; Carmon Ginsberg, MD; Alinda Money, MD; Hosie Poisson, MD; Mayford Knife, MD o [address] [place], [place] [address] o [phone] o Mon-Fri 8:30-5:00 (extended evenings Mon-Thur as needed), Sat-Sun 10:00-1:00 o Providers come to see babies at [facility] o Accepting Medicaid for families of first-time babies and families with all children in the household age 65 and under. Must register with office prior to making appointment (M-F only). [facility] Odella Aquas, NP; Lynelle Doctor, MD; Susann Givens, MD; [place], [place] o [address] [place], [place] [address] o [phone] o Mon-Fri 8:00-5:00 o Babies seen by providers at [facility] o Does NOT accept Medicaid/Commercial Insurance Only . [facility] [place] [facility]  Suzette Battiest, MD; Zachery Dauer, MD; Stefan Church, MD; Sabino Dick, MD; Quitman Livings, MD; Farris Has, MD; Gaynell Face, MD; Betha Loa, MD; Colon Flattery, MD; Clifton James, MD o [address] [place], [place] [address] o [phone] o Mon-Fri 8:30-5:30, Sat (Oct.-Mar.) 9:00-1:00 o Babies seen by providers at [facility] o Accepting [facility] [phone]) . [facility] Dimitri, MD; Sheliah Hatch, MD o [address], [place], [place] [address] o [phone] o Mon-Fri 8:30-5:00, Sat 8:30-12:00 o Providers come to see babies at [facility] o Does NOT accept Medicaid . [facility] Cindy Hazy, [place]; Tracie Harrier, MD; [place], [place]; Wynelle Link, MD; Azucena Cecil, MD o [address], [place], [place] [address] o [phone]  o Mon-Fri 8:00-5:00 o Babies seen by  providers at [facility] o Does NOT accept Medicaid o Only accepting babies of parents who are patients o Please call early in hospitalization for appointment (limited availability) . [facility] Lamar Benes, MD; Abran Cantor, MD; Early Osmond, MD; Cherre Huger, NP; Hyacinth Meeker, MD; Dwan Bolt, MD; Jarold Motto, NP; Dario Guardian, MD; Talmage Nap, MD; Maisie Fus, MD; Pricilla Holm, MD; Tama High, MD o [address] [place], [place], [place] [address] o [phone] o Mon-Fri 8:00-5:00, Sat 9:00-12:00 o Providers come to see babies at [facility] o Does NOT accept [facility] [phone]) . [facility] at [facility] o Limited providers accepting new patients: Drema Pry, NP; [place], PA o [address] [place], [place] [address] o [phone] o Mon-Fri 8:00-5:00 o Babies seen by providers at [facility] o Does NOT accept Medicaid o Only accepting babies of parents who are patients o Please call early in hospitalization for appointment (limited availability) . [facility] Luan Pulling, MD; Nash Dimmer, MD o [address] [place] [place], [place] [address] o [phone] (press 1 to schedule appointment) o Mon-Fri 8:00-5:00 o Providers come to see babies at [facility] o Does NOT accept Medicaid . [facility] Cristino Martes, MD o [address] [place], [place] [address] o [phone] o Mon-Fri 8:30-5:00 (lunch 12:30-1:00), extended hours by appointment only Wed 5:00-6:30 o Babies seen by [facility] providers o Accepting Medicaid . [facility], MD; [place], MD; Hassan Rowan, MD o [address] [place], [place], [place] [address] o [phone] o Mon-Fri 8:00-5:00 o Babies seen by [facility] providers o Does NOT accept Medicaid . Nature conservation officer at [facility] [address] Elsworth Soho, MD; Durene Cal, MD; [place], DO o [address] [place], [place] [address] o [phone] o Mon-Fri 8:00-5:00 o Babies seen by [facility] providers o Does  NOT accept Medicaid . [facility] o [place], [place]; [place], [place]; [place], NP; Avis Epley, MD; Vonna Kotyk, MD; Clance Boll, MD; Stevphen [place], NP; Arvilla Market, NP; Ann Maki, NP; Otis Dials, NP; Vaughan Basta, MD; [place], MD o [address] [place], [place] [address] o [phone] o Mon-Fri 8:30-5:00, Sat 10:00-1:00 o Providers come to see babies at [facility] o Does NOT accept Medicaid o Free prenatal information session Tuesdays at 4:45pm . [facility] Luna Kitchens, MD; [place], [place]; [place], [place]; [place], [place] o [address] [place] [address] o [phone] o Mon-Fri 7:30-5:30 o Babies seen by [facility] providers . [facility] o [address], [place], [place]  [address] o [phone]   Fax - [phone]  [place] [phone] & [phone]) . [facility] Alphonsa Overall, MD o [address] [place], [place] [address] o [phone] o Mon-Thur 8:00-6:00 o Providers come to see babies at [facility] o Accepting Medicaid . [facility] Zenon Mayo, NP; Cyndia Bent, MD; [place], [place]; [place], [place] o [address] [place], [place] [address] o [phone] o Mon-Thur 7:30-7:30, Fri 7:30-4:30 o Babies seen by [facility] providers o Accepting Medicaid . [facility] Cheryle Horsfall, MD; Janene Harvey, NP; Vonita Moss, MD o [address], [place], [place] [address] o [phone] o Mon-Fri 8:30-5:00, Sat 8:30-12:00 o Providers come to see babies at [facility] o Accepting Medicaid o Must have "Meet & Greet" appointment at office prior to delivery . [facility] [place]) Llana Aliment, MD; Earlene Plater, MD; Lucretia Roers, MD o [address], [place], [place] [address] o [phone] o Mon-Wed 8:00-6:00, Thur-Fri 8:00-5:00,  Sat 9:00-12:00 o Providers come to see babies at [facility] o Does NOT accept Medicaid o Only accepting siblings of current  patients . [facility]  o [address], [place], [place]  [address] o [phone]   Fax - [phone] . [facility] o [phone], [place], [place]  [address] o [phone]   Fax - [phone]  [place] [phone] & [phone]) . Nature conservation officer at [facility] o [place], DO; [place], DO o [address] [place], [place] [address] o [phone] o Mon-Fri 7:00-5:00 o Babies seen by [facility] providers o Does NOT accept Medicaid . [facility] Ellis Savage, MD; [place], [place]; [place], [place] o [address], [place], [place] [address] o [phone] o Mon-Fri 8:00-5:00 o Babies seen by [facility] providers o Accepting Medicaid . [facility] [address] Franne Forts, MD; [place], [place]; [place], NP; [place], [place] o [address] [place], [place], [place] [address] o [phone] o Mon-Fri 8:00-5:00 o Babies seen by providers at [facility] o Accepting [facility] [place] [phone]) . [facility] [place], [place] o [address] [place], [place] [address] o [phone] o Mon-Fri 8:00-5:00 o Babies seen by [facility] providers o Does NOT accept Medicaid o Limited availability, please call early in hospitalization to schedule follow-up . [facility] Jolee Ewing, PA; Eddie Candle, MD; [place], MD; [place], [place]; Constance Goltz, MD; [place], [place] o [address] [facility] [address], [place], [place] [address] o [phone] o Mon-Fri 8:30-5:00, Sat 9:00-12:00 o Babies seen by providers at [facility] o Accepting Medicaid o Please register online then schedule online or call office o [email or web address] . [facility]) Samuella Bruin, NP; Lucianne Muss, MD; Lanier Clam, PA o [address], [place], [place] [address]  o [phone] o Mon-Fri 8:00-5:00 o Babies seen by providers at [facility] o Accepting Medicaid . [facility]) Sharin Mons, MD; Reed Breech, NP; Shelva Majestic, MD o [address], [place], [place] [address] o [phone] o Mon-Fri 8:00-5:30, Sat&Sun by appointment (phones open at 8:30) o Babies seen by [facility] providers o Accepting Medicaid o Must be a first-time baby or sibling of current patient . [facility] [address], [place], [place]  [address] o [phone]   Fax - [phone]  [place] [phone] & [phone]) . [facility] o [place], [place]; [place], [place]; Dimple Casey, MD; [place], [place]; Carolyne Fiscal, MD o [address], [place], [place] [address] o [phone] o Mon-Thur 8:00-7:00, Fri 8:00-5:00, Sat 8:00-12:00, Sun 9:00-12:00 o Babies seen by [facility] providers o Accepting Medicaid . [facility], MD; Gaynell Face, MD; [facility], MD o [address], [place], [place] [address] o [phone] o Mon-Thur 8:00-5:00 o Babies seen by providers at [facility] o Accepting Medicaid . [facility] [place] Gwenlyn Saran, MD; Coe[place]Go[place], MD; Madilyn Fireman, MD; Melvyn Neth, MD; List, MD; Lazarus Salines, MD; Gaynell Face, MD; Berneda Rose, MD; Flora Lipps, MD; Beryl Meager, MD; Luther Redo, MD; Lavonia Drafts, MD; Kellie Simmering, MD o [address] [place] [place], [place] [address] o [phone] o Mon-Fri 8:00-5:30, Sat (Oct.-Mar.) 9:00-1:00 o Babies seen by providers at [facility] o Accepting Medicaid o Must  fill out new patient packet, available online at [email or web address] . [facility] [place] [facility]) Simone Curia, NP; Tiburcio Pea, NP; Tresa Endo, NP; Whitney Post, MD; [place], [place]; Hennie Duos, MD; [place], MD; Kavin Leech, NP o [address], [place], [place] [address] o  [phone] o Mon-Thur 8:00-5:30, Fri 8:00-5:00 o Babies seen by providers at [facility] o Accepting [facility] [phone]) . [facility] o [place], [place]; Fay, MD; Tanya Nones, MD; [place], [place] o [address] [place], [place] [address] o [phone] o Mon-Fri 8:00-5:00 o Babies seen by providers at [facility] o Accepting [facility] [phone]) . [facility] o [place], DO; Lenise Arena, MD; [place], [place] o [address], [place], [place] [address] o [phone] o Mon-Fri 8:00-5:00 o Babies seen by providers at [facility] o Does NOT accept Medicaid o Limited appointment availability, please call early in hospitalization  . Nature conservation officer at [facility] o [place], DO; [place], [place] o [address] [place], [place] [address] o [phone] o Mon-Fri 8:00-5:00 o Babies seen by [facility] providers o Does NOT accept Medicaid . [facility] Lorrine Kin, MD; Ninetta Lights, MD; [place], [place]; [place], [place] o [address], [place], [place] [address] o [phone] o Mon-Fri 8:00-5:00 o After hours clinic [facility] [place], [place] [address]) [phone] Mon-Fri 5:00-8:00, Sat 12:00-6:00, Sun 10:00-4:00 o Babies seen by [facility] providers o Accepting Medicaid . [facility] o [address], [place], [place]  [address] o [phone]   Fax - [phone]  [place] [phone]) . Nature conservation officer at [facility], [place] o [address] [place], [place], [place] [address] o [phone] o Mon-Fri 8:00-5:00 o Babies seen by [facility] providers o Does NOT accept Medicaid . [facility] [place] [facility] [place]) Tomi Likens, MD o [address] [place] [address], [place], [place] [address] o [phone] o Mon-Thur 8:00-7:00, Fri 8:00-5:00, Sat 8:00-12:00 o Babies seen by providers at [facility] o Accepting Medicaid - but does not have vaccinations in office (must be received elsewhere) o Limited availability, please call early in hospitalization  [place] ([id number]) . [facility]  o Wyvonne Lenz, MD o [address] [place] [address] o [phone]  Fax [phone]

## 2019-02-08 LAB — COMPREHENSIVE METABOLIC PANEL
ALT: 14 IU/L (ref 0–32)
AST: 12 IU/L (ref 0–40)
Albumin/Globulin Ratio: 1.2 (ref 1.2–2.2)
Albumin: 3.4 g/dL — ABNORMAL LOW (ref 3.9–5.0)
Alkaline Phosphatase: 115 IU/L (ref 39–117)
BUN/Creatinine Ratio: 7 — ABNORMAL LOW (ref 9–23)
BUN: 4 mg/dL — ABNORMAL LOW (ref 6–20)
Bilirubin Total: <0.2 mg/dL (ref 0.0–1.2)
CO2: 21 mmol/L (ref 20–29)
Calcium: 8.6 mg/dL — ABNORMAL LOW (ref 8.7–10.2)
Chloride: 104 mmol/L (ref 96–106)
Creatinine, Ser: 0.57 mg/dL (ref 0.57–1.00)
GFR calc Af Amer: 147 mL/min/{1.73_m2} (ref >59)
GFR calc non Af Amer: 127 mL/min/{1.73_m2} (ref >59)
Globulin, Total: 2.8 g/dL (ref 1.5–4.5)
Glucose: 119 mg/dL — ABNORMAL HIGH (ref 65–99)
Potassium: 3.5 mmol/L (ref 3.5–5.2)
Sodium: 139 mmol/L (ref 134–144)
Total Protein: 6.2 g/dL (ref 6.0–8.5)

## 2019-02-08 LAB — GLUCOSE TOLERANCE, 2 HOURS W/ 1HR
Glucose, 1 hour: 136 mg/dL (ref 65–179)
Glucose, 2 hour: 115 mg/dL (ref 65–152)
Glucose, Fasting: 82 mg/dL (ref 65–91)

## 2019-02-08 LAB — PROTEIN / CREATININE RATIO, URINE
Creatinine, Urine: 111.8 mg/dL
Protein, Ur: 10.7 mg/dL
Protein/Creat Ratio: 96 mg/g creat (ref 0–200)

## 2019-02-09 LAB — CULTURE, OB URINE

## 2019-02-09 LAB — URINE CULTURE, OB REFLEX

## 2019-02-13 ENCOUNTER — Telehealth: Payer: Self-pay | Admitting: General Practice

## 2019-02-13 NOTE — Telephone Encounter (Signed)
Received phone call from [facility] that patient had an elevated BP of 144/83, no repeats were taken.   Called patient, no answer- left message asking she call us back regarding her blood pressure. Will send mychart message.

## 2019-02-14 ENCOUNTER — Ambulatory Visit: Payer: Medicaid Other

## 2019-02-28 ENCOUNTER — Ambulatory Visit (INDEPENDENT_AMBULATORY_CARE_PROVIDER_SITE_OTHER): Payer: Medicaid Other | Admitting: Student

## 2019-02-28 ENCOUNTER — Other Ambulatory Visit: Payer: Self-pay

## 2019-02-28 DIAGNOSIS — Z3493 Encounter for supervision of normal pregnancy, unspecified, third trimester: Secondary | ICD-10-CM

## 2019-02-28 DIAGNOSIS — Z3A32 32 weeks gestation of pregnancy: Secondary | ICD-10-CM

## 2019-02-28 DIAGNOSIS — Z23 Encounter for immunization: Secondary | ICD-10-CM

## 2019-02-28 MED ORDER — ASPIRIN 81 MG PO TBEC: 81.0000 mg | DELAYED_RELEASE_TABLET | Freq: Every day | ORAL | 12 refills | Status: DC

## 2019-02-28 NOTE — Patient Instructions (Signed)
-  Please call [facility] to request new sized cuff, or purchase larger cuff BP  Machine.

## 2019-02-28 NOTE — Progress Notes (Signed)
   PRENATAL VISIT NOTE  Subjective:  Cassidy Gutierrez is a 28 y.o. G2P0010 at [redacted]w[redacted]d being seen today for ongoing prenatal care.  She is currently monitored for the following issues for this low-risk pregnancy and has Supervision of low-risk pregnancy; Sickle cell trait ([place]); Insufficient prenatal care; Maternal obesity affecting pregnancy, antepartum; and UTI (urinary tract infection) during pregnancy, first trimester on their problem list.  Patient reports no complaints.  Contractions: Not present. Vag. Bleeding: None.  Movement: Present. Denies leaking of fluid.   The following portions of the patient's history were reviewed and updated as appropriate: allergies, current medications, past family history, past medical history, past social history, past surgical history and problem list.   Objective:   Vitals:   02/28/19 1547  BP: 132/73  Weight: 237 lb 14.4 oz (107.9 kg)    Fetal Status: Fetal Heart Rate (bpm): 145 Fundal Height: 34 cm Movement: Present     General:  Alert, oriented and cooperative. Patient is in no acute distress.  Skin: Skin is warm and dry. No rash noted.   Cardiovascular: Normal heart rate noted  Respiratory: Normal respiratory effort, no problems with respiration noted  Abdomen: Soft, gravid, appropriate for gestational age.  Pain/Pressure: Present     Pelvic: Cervical exam deferred        Extremities: Normal range of motion.  Edema: Trace  Mental Status: Normal mood and affect. Normal behavior. Normal judgment and thought content.   Assessment and Plan:  Pregnancy: G2P0010 at [redacted]w[redacted]d 1. Encounter for supervision of low-risk pregnancy in third trimester -No complaints, doing well.  -BP is elevated but patient thinks its due to cuff being too small; when BP was taken at clinic, with red cuff, her blood pressures are normal.  -patient will call for larger cuff size and RN will call her in the next week to make sure she has new cuff and is checking it regularly.  -  Tdap vaccine greater than or equal to 7yo IM - aspirin (ASPIRIN 81) 81 MG EC tablet; Take 1 tablet (81 mg total) by mouth daily. Swallow whole.  Dispense: 30 tablet; Refill: 12 - CBC - HIV Antibody (routine testing w rflx) - RPR -still does not have peds; has list at home.  -Discussed birth control; patient had IUD and didn't like it (it got "lost"); does not want Nexplanon. Wants to try birth control pills/patch/ring.    Preterm labor symptoms and general obstetric precautions including but not limited to vaginal bleeding, contractions, leaking of fluid and fetal movement were reviewed in detail with the patient. Please refer to After Visit Summary for other counseling recommendations.   Return Virtual visit in two weeks, and RN BP check over the phone on 10-16 or 10-19.  Future Appointments  Date Time Provider [place]  03/06/2019 10:00 AM [place]  03/14/2019  3:35 PM Nugent, Gerrie Nordmann, NP [facility]    Mervyn Skeeters [place], [place]

## 2019-03-01 LAB — CBC
Hematocrit: 29.8 % — ABNORMAL LOW (ref 34.0–46.6)
Hemoglobin: 9.7 g/dL — ABNORMAL LOW (ref 11.1–15.9)
MCH: 23.3 pg — ABNORMAL LOW (ref 26.6–33.0)
MCHC: 32.6 g/dL (ref 31.5–35.7)
MCV: 72 fL — ABNORMAL LOW (ref 79–97)
Platelets: 225 10*3/uL (ref 150–450)
RBC: 4.16 x10E6/uL (ref 3.77–5.28)
RDW: 15.6 % — ABNORMAL HIGH (ref 11.7–15.4)
WBC: 6.9 10*3/uL (ref 3.4–10.8)

## 2019-03-01 LAB — HIV ANTIBODY (ROUTINE TESTING W REFLEX): HIV Screen 4th Generation wRfx: NONREACTIVE

## 2019-03-01 LAB — RPR: RPR Ser Ql: NONREACTIVE

## 2019-03-03 ENCOUNTER — Other Ambulatory Visit: Payer: Self-pay | Admitting: Student

## 2019-03-03 DIAGNOSIS — D508 Other iron deficiency anemias: Secondary | ICD-10-CM

## 2019-03-03 DIAGNOSIS — D649 Anemia, unspecified: Secondary | ICD-10-CM | POA: Insufficient documentation

## 2019-03-03 MED ORDER — FERROUS FUMARATE 325 (106 FE) MG PO TABS: 1.0000 | ORAL_TABLET | Freq: Every day | ORAL | 0 refills | Status: DC

## 2019-03-06 ENCOUNTER — Other Ambulatory Visit: Payer: Self-pay

## 2019-03-06 ENCOUNTER — Encounter: Payer: Self-pay | Admitting: Emergency Medicine

## 2019-03-06 ENCOUNTER — Telehealth (INDEPENDENT_AMBULATORY_CARE_PROVIDER_SITE_OTHER): Payer: Medicaid Other | Admitting: Emergency Medicine

## 2019-03-06 DIAGNOSIS — Z0130 Encounter for examination of blood pressure without abnormal findings: Secondary | ICD-10-CM

## 2019-03-06 NOTE — Progress Notes (Signed)
Agree with A & P. 

## 2019-03-06 NOTE — Progress Notes (Signed)
I connected with  Cassidy Gutierrez on 03/06/19 at 10:00 AM EDT by telephone and verified that I am speaking with the correct person using two identifiers.   I discussed the limitations, risks, security and privacy concerns of performing an evaluation and management service by telephone and the availability of in person appointments. I also discussed with the patient that there may be a patient responsible charge related to this service. The patient expressed understanding and agreed to proceed.  Pt being contacted today for BP check. Pt reports not getting a larger cuff to take blood pressure at home and current blood pressure cuff not reading accurately due to small size. Pt denies headache, swelling, and changes in vision. Pt took BP with current cuff and BP was 144/90. Pt stated she would call the pharmacy as soon as we ended the call to get the new cuff. Per Dr. Rip Harbour, pt needs to schedule an in person nurse visit for blood pressure check due to possible inaccuracy and pt to get new blood pressure cuff this week. Message sent to front office for appointment request.   Cassidy Sousa, RN 03/06/19   [phone]

## 2019-03-10 ENCOUNTER — Ambulatory Visit: Payer: Medicaid Other

## 2019-03-13 NOTE — Progress Notes (Signed)
I connected with Cassidy Gutierrez on 03/14/19 at  3:35 PM EDT by: MyChart Video and verified that I am speaking with the correct person using two identifiers.  Patient is located at home and provider is located at [facility] office.     The purpose of this virtual visit is to provide medical care while limiting exposure to the novel coronavirus. I discussed the limitations, risks, security and privacy concerns of performing an evaluation and management service by MyChart Video and the availability of in person appointments. I also discussed with the patient that there may be a patient responsible charge related to this service. By engaging in this virtual visit, you consent to the provision of healthcare.  Additionally, you authorize for your insurance to be billed for the services provided during this visit.  The patient expressed understanding and agreed to proceed.  The following staff members participated in the virtual visit:  Vernice Jefferson, NP    PRENATAL VISIT NOTE  Subjective:  Cassidy Gutierrez is a 28 y.o. G2P0010 at [redacted]w[redacted]d  for phone visit for ongoing prenatal care.  She is currently monitored for the following issues for this low-risk pregnancy and has Supervision of low-risk pregnancy; Sickle cell trait ([place]); Insufficient prenatal care; Maternal obesity affecting pregnancy, antepartum; UTI (urinary tract infection) during pregnancy, first trimester; and Anemia on their problem list.  Patient reports no complaints.  Contractions: Irritability. Vag. Bleeding: None.  Movement: Present. Denies leaking of fluid.   The following portions of the patient's history were reviewed and updated as appropriate: allergies, current medications, past family history, past medical history, past social history, past surgical history and problem list.   Objective:   Vitals:   03/14/19 1522  BP: 132/79  Pulse: (!) 103   Self-Obtained  Fetal Status:     Movement: Present     Assessment and Plan:  Pregnancy:  G2P0010 at [redacted]w[redacted]d  1. Encounter for supervision of low-risk pregnancy in third trimester -BP today 132/79 -s/sx of preeclampsia discussed, pt had baseline labs drawn -pt advised can come to clinic any time for flu vaccine -discussed contraception, pt still undecided, information given  2. Sickle cell trait ([facility]) -urine culture at next visit, future order entered  3. Other iron deficiency anemia - hgb 9.7 two weeks ago, RX for oral iron given by provider, but patient reports she has not yet picked them up and started them because she was not aware an RX had been sent. Pt advised to pick up oral iron today and start immediately. - Hemoglobin and hematocrit, blood; Future - Iron, TIBC and Ferritin Panel; Future - referral entered for nutrition services  4. Maternal obesity affecting pregnancy, antepartum -per MFM [place] on 02/03/2019, f/u as indicated  Preterm labor symptoms and general obstetric precautions including but not limited to vaginal bleeding, contractions, leaking of fluid and fetal movement were reviewed in detail with the patient.  Return in about 2 weeks (around 03/28/2019) for in-person LOB, GBS/cultures, also needs appointment with nutrition services.  No future appointments. Time spent on virtual visit: 10 minutes  Clarisa Fling, NP

## 2019-03-14 ENCOUNTER — Other Ambulatory Visit: Payer: Self-pay

## 2019-03-14 ENCOUNTER — Telehealth (INDEPENDENT_AMBULATORY_CARE_PROVIDER_SITE_OTHER): Payer: Medicaid Other | Admitting: Women's Health

## 2019-03-14 VITALS — BP 132/79 | HR 103

## 2019-03-14 DIAGNOSIS — O99213 Obesity complicating pregnancy, third trimester: Secondary | ICD-10-CM

## 2019-03-14 DIAGNOSIS — D508 Other iron deficiency anemias: Secondary | ICD-10-CM

## 2019-03-14 DIAGNOSIS — O99210 Obesity complicating pregnancy, unspecified trimester: Secondary | ICD-10-CM

## 2019-03-14 DIAGNOSIS — D573 Sickle-cell trait: Secondary | ICD-10-CM

## 2019-03-14 DIAGNOSIS — O2341 Unspecified infection of urinary tract in pregnancy, first trimester: Secondary | ICD-10-CM

## 2019-03-14 DIAGNOSIS — O0932 Supervision of pregnancy with insufficient antenatal care, second trimester: Secondary | ICD-10-CM

## 2019-03-14 DIAGNOSIS — Z3493 Encounter for supervision of normal pregnancy, unspecified, third trimester: Secondary | ICD-10-CM

## 2019-03-14 DIAGNOSIS — Z3A34 34 weeks gestation of pregnancy: Secondary | ICD-10-CM

## 2019-03-14 DIAGNOSIS — O2343 Unspecified infection of urinary tract in pregnancy, third trimester: Secondary | ICD-10-CM

## 2019-03-14 DIAGNOSIS — O0933 Supervision of pregnancy with insufficient antenatal care, third trimester: Secondary | ICD-10-CM

## 2019-03-14 NOTE — Progress Notes (Signed)
I connected with  [name] on 03/14/19 at  3:35 PM EDT by telephone and verified that I am speaking with the correct person using two identifiers.   I discussed the limitations, risks, security and privacy concerns of performing an evaluation and management service by telephone and the availability of in person appointments. I also discussed with the patient that there may be a patient responsible charge related to this service. The patient expressed understanding and agreed to proceed.  Bethanne Ginger, CMA 03/14/2019  3:19 PM

## 2019-03-14 NOTE — Patient Instructions (Addendum)
The Maternity Assessment Unit (MAU) is located at the [facility] at [facility]. The address is: [address] [place], [place], [place] [address]. Please see map below for additional directions.    The Maternity Assessment Unit is designed to help you during your pregnancy, and for up to 6 weeks after delivery, with any pregnancy- or postpartum-related emergencies, if you think you are in labor, or if your water has broken. For example, if you experience nausea and vomiting, vaginal bleeding, severe abdominal or pelvic pain, elevated blood pressure or other problems related to your pregnancy or postpartum time, please come to the Maternity Assessment Unit for assistance.  Preterm Labor and Birth Information  The normal length of a pregnancy is 39-41 weeks. Preterm labor is when labor starts before 37 completed weeks of pregnancy. What are the risk factors for preterm labor? Preterm labor is more likely to occur in women who:  Have certain infections during pregnancy such as a bladder infection, sexually transmitted infection, or infection inside the uterus (chorioamnionitis).  Have a shorter-than-normal cervix.  Have gone into preterm labor before.  Have had surgery on their cervix.  Are younger than age 30 or older than age 73.  Are African American.  Are pregnant with twins or multiple babies (multiple gestation).  Take street drugs or smoke while pregnant.  Do not gain enough weight while pregnant.  Became pregnant shortly after having been pregnant. What are the symptoms of preterm labor? Symptoms of preterm labor include:  Cramps similar to those that can happen during a menstrual period. The cramps may happen with diarrhea.  Pain in the abdomen or lower back.  Regular uterine contractions that may feel like tightening of the abdomen.  A feeling of increased pressure in the pelvis.  Increased watery or bloody mucus discharge from the  vagina.  Water breaking (ruptured amniotic sac). Why is it important to recognize signs of preterm labor? It is important to recognize signs of preterm labor because babies who are born prematurely may not be fully developed. This can put them at an increased risk for:  Long-term (chronic) heart and lung problems.  Difficulty immediately after birth with regulating body systems, including blood sugar, body temperature, heart rate, and breathing rate.  Bleeding in the brain.  Cerebral palsy.  Learning difficulties.  Death. These risks are highest for babies who are born before 34 weeks of pregnancy. How is preterm labor treated? Treatment depends on the length of your pregnancy, your condition, and the health of your baby. It may involve:  Having a stitch (suture) placed in your cervix to prevent your cervix from opening too early (cerclage).  Taking or being given medicines, such as: ? Hormone medicines. These may be given early in pregnancy to help support the pregnancy. ? Medicine to stop contractions. ? Medicines to help mature the babys lungs. These may be prescribed if the risk of delivery is high. ? Medicines to prevent your baby from developing cerebral palsy. If the labor happens before 34 weeks of pregnancy, you may need to stay in the hospital. What should I do if I think I am in preterm labor? If you think that you are going into preterm labor, call your health care provider right away. How can I prevent preterm labor in future pregnancies? To increase your chance of having a full-term pregnancy:  Do not use any tobacco products, such as cigarettes, chewing tobacco, and e-cigarettes. If you need help quitting, ask  your health care provider.  Do not use street drugs or medicines that have not been prescribed to you during your pregnancy.  Talk with your health care provider before taking any herbal supplements, even if you have been taking them regularly.  Make sure  you gain a healthy amount of weight during your pregnancy.  Watch for infection. If you think that you might have an infection, get it checked right away.  Make sure to tell your health care provider if you have gone into preterm labor before. This information is not intended to replace advice given to you by your health care provider. Make sure you discuss any questions you have with your health care provider. Document Released: 07/25/2003 Document Revised: 08/26/2018 Document Reviewed: 09/25/2015 Elsevier Patient Education  2020 [employer].    https://www.cdc.gov/vaccines/hcp/vis/vis-statements/tdap.pdf">  Tdap Vaccine (Tetanus, Diphtheria and Pertussis): What You Need to Know 1. Why get vaccinated? Tetanus, diphtheria and pertussis are very serious diseases. Tdap vaccine can protect [place] from these diseases. And, Tdap vaccine given to pregnant women can protect newborn babies against pertussis.Marland Kitchen TETANUS (Lockjaw) is rare in the [place] today. It causes painful muscle tightening and stiffness, usually all over the body.  It can lead to tightening of muscles in the head and neck so you can't open your mouth, swallow, or sometimes even breathe. Tetanus kills about 1 out of 10 people who are infected even after receiving the best medical care. DIPHTHERIA is also rare in the [place] today. It can cause a thick coating to form in the back of the throat.  It can lead to breathing problems, heart failure, paralysis, and death. PERTUSSIS (Whooping Cough) causes severe coughing spells, which can cause difficulty breathing, vomiting and disturbed sleep.  It can also lead to weight loss, incontinence, and rib fractures. Up to 2 in 100 adolescents and 5 in 100 adults with pertussis are hospitalized or have complications, which could include pneumonia or death. These diseases are caused by bacteria. Diphtheria and pertussis are spread from person to person through secretions from coughing  or sneezing. Tetanus enters the body through cuts, scratches, or wounds. Before vaccines, as many as 200,000 cases of diphtheria, 200,000 cases of pertussis, and hundreds of cases of tetanus, were reported in the [place] each year. Since vaccination began, reports of cases for tetanus and diphtheria have dropped by about 99% and for pertussis by about 80%. 2. Tdap vaccine Tdap vaccine can protect adolescents and adults from tetanus, diphtheria, and pertussis. One dose of Tdap is routinely given at age 50 or 46. People who did not get Tdap at that age should get it as soon as possible. Tdap is especially important for healthcare professionals and anyone having close contact with a baby younger than 12 months. Pregnant women should get a dose of Tdap during every pregnancy, to protect the newborn from pertussis. Infants are most at risk for severe, life-threatening complications from pertussis. Another vaccine, called Td, protects against tetanus and diphtheria, but not pertussis. A Td booster should be given every 10 years. Tdap may be given as one of these boosters if you have never gotten Tdap before. Tdap may also be given after a severe cut or burn to prevent tetanus infection. Your doctor or the person giving you the vaccine can give you more information. Tdap may safely be given at the same time as other vaccines. 3. Some people should not get this vaccine  A person who has ever had a life-threatening allergic  reaction after a previous dose of any diphtheria, tetanus or pertussis containing vaccine, OR has a severe allergy to any part of this vaccine, should not get Tdap vaccine. Tell the person giving the vaccine about any severe allergies.  Anyone who had coma or long repeated seizures within 7 days after a childhood dose of DTP or DTaP, or a previous dose of Tdap, should not get Tdap, unless a cause other than the vaccine was found. They can still get Td.  Talk to your doctor if  you: ? have seizures or another nervous system problem, ? had severe pain or swelling after any vaccine containing diphtheria, tetanus or pertussis, ? ever had a condition called Guillain-Barr Syndrome (GBS), ? aren't feeling well on the day the shot is scheduled. 4. Risks With any medicine, including vaccines, there is a chance of side effects. These are usually mild and go away on their own. Serious reactions are also possible but are rare. Most people who get Tdap vaccine do not have any problems with it. Mild problems following Tdap (Did not interfere with activities)  Pain where the shot was given (about 3 in 4 adolescents or 2 in 3 adults)  Redness or swelling where the shot was given (about 1 person in 5)  Mild fever of at least 100.25F (up to about 1 in 25 adolescents or 1 in 100 adults)  Headache (about 3 or 4 people in 10)  Tiredness (about 1 person in 3 or 4)  Nausea, vomiting, diarrhea, stomach ache (up to 1 in 4 adolescents or 1 in 10 adults)  Chills, sore joints (about 1 person in 10)  Body aches (about 1 person in 3 or 4)  Rash, swollen glands (uncommon) Moderate problems following Tdap (Interfered with activities, but did not require medical attention)  Pain where the shot was given (up to 1 in 5 or 6)  Redness or swelling where the shot was given (up to about 1 in 16 adolescents or 1 in 12 adults)  Fever over 102F (about 1 in 100 adolescents or 1 in 250 adults)  Headache (about 1 in 7 adolescents or 1 in 10 adults)  Nausea, vomiting, diarrhea, stomach ache (up to 1 or 3 people in 100)  Swelling of the entire arm where the shot was given (up to about 1 in 500). Severe problems following Tdap (Unable to perform usual activities; required medical attention)  Swelling, severe pain, bleeding and redness in the arm where the shot was given (rare). Problems that could happen after any vaccine:  People sometimes faint after a medical procedure, including  vaccination. Sitting or lying down for about 15 minutes can help prevent fainting, and injuries caused by a fall. Tell your doctor if you feel dizzy, or have vision changes or ringing in the ears.  Some people get severe pain in the shoulder and have difficulty moving the arm where a shot was given. This happens very rarely.  Any medication can cause a severe allergic reaction. Such reactions from a vaccine are very rare, estimated at fewer than 1 in a million doses, and would happen within a few minutes to a few hours after the vaccination. As with any medicine, there is a very remote chance of a vaccine causing a serious injury or death. The safety of vaccines is always being monitored. For more information, visit: http://www.aguilar.org/ 5. What if there is a serious problem? What should I look for?  Look for anything that concerns you, such as signs  of a severe allergic reaction, very high fever, or unusual behavior. Signs of a severe allergic reaction can include hives, swelling of the face and throat, difficulty breathing, a fast heartbeat, dizziness, and weakness. These would usually start a few minutes to a few hours after the vaccination. What should I do?  If you think it is a severe allergic reaction or other emergency that can't wait, call 9-1-1 or get the person to the nearest hospital. Otherwise, call your doctor.  Afterward, the reaction should be reported to the Vaccine Adverse Event Reporting System (VAERS). Your doctor might file this report, or you can do it yourself through the VAERS web site at www.vaers.LAgents.no, or by calling 1-4587307749. VAERS does not give medical advice. 6. The National Vaccine Injury Compensation Program The [employer] Vaccine Injury Compensation Program (VICP) is a federal program that was created to compensate people who may have been injured by certain vaccines. Persons who believe they may have been injured by a vaccine can learn about the  program and about filing a claim by calling 1-262-501-2211 or visiting the VICP website at SpiritualWord.at. There is a time limit to file a claim for compensation. 7. How can I learn more?  Ask your doctor. He or she can give you the vaccine package insert or suggest other sources of information.  Call your local or state health department.  Contact the Centers for Disease Control and Prevention (CDC): ? Call 619-231-1613 (1-800-CDC-INFO) or ? Visit CDC's website at PicCapture.uy Vaccine Information Statement Tdap Vaccine (07/11/2013) This information is not intended to replace advice given to you by your health care provider. Make sure you discuss any questions you have with your health care provider. Document Released: 11/03/2011 Document Revised: 12/20/2017 Document Reviewed: 12/20/2017 Elsevier Interactive Patient Education  [employer].     Third Trimester of Pregnancy The third trimester is from week 28 through week 40 (months 7 through 9). The third trimester is a time when the unborn baby (fetus) is growing rapidly. At the end of the ninth month, the fetus is about 20 inches in length and weighs 6-10 pounds. Body changes during your third trimester Your body will continue to go through many changes during pregnancy. The changes vary from woman to woman. During the third trimester:  Your weight will continue to increase. You can expect to gain 25-35 pounds (11-16 kg) by the end of the pregnancy.  You may begin to get stretch marks on your hips, abdomen, and breasts.  You may urinate more often because the fetus is moving lower into your pelvis and pressing on your bladder.  You may develop or continue to have heartburn. This is caused by increased hormones that slow down muscles in the digestive tract.  You may develop or continue to have constipation because increased hormones slow digestion and cause the muscles that push waste through your  intestines to relax.  You may develop hemorrhoids. These are swollen veins (varicose veins) in the rectum that can itch or be painful.  You may develop swollen, bulging veins (varicose veins) in your legs.  You may have increased body aches in the pelvis, back, or thighs. This is due to weight gain and increased hormones that are relaxing your joints.  You may have changes in your hair. These can include thickening of your hair, rapid growth, and changes in texture. Some women also have hair loss during or after pregnancy, or hair that feels dry or thin. Your hair will most likely return  to normal after your baby is born.  Your breasts will continue to grow and they will continue to become tender. A yellow fluid (colostrum) may leak from your breasts. This is the first milk you are producing for your baby.  Your belly button may stick out.  You may notice more swelling in your hands, face, or ankles.  You may have increased tingling or numbness in your hands, arms, and legs. The skin on your belly may also feel numb.  You may feel short of breath because of your expanding uterus.  You may have more problems sleeping. This can be caused by the size of your belly, increased need to urinate, and an increase in your body's metabolism.  You may notice the fetus "dropping," or moving lower in your abdomen (lightening).  You may have increased vaginal discharge.  You may notice your joints feel loose and you may have pain around your pelvic bone. What to expect at prenatal visits You will have prenatal exams every 2 weeks until week 36. Then you will have weekly prenatal exams. During a routine prenatal visit:  You will be weighed to make sure you and the baby are growing normally.  Your blood pressure will be taken.  Your abdomen will be measured to track your baby's growth.  The fetal heartbeat will be listened to.  Any test results from the previous visit will be discussed.  You  may have a cervical check near your due date to see if your cervix has softened or thinned (effaced).  You will be tested for Group B streptococcus. This happens between 35 and 37 weeks. Your health care provider may ask you:  What your birth plan is.  How you are feeling.  If you are feeling the baby move.  If you have had any abnormal symptoms, such as leaking fluid, bleeding, severe headaches, or abdominal cramping.  If you are using any tobacco products, including cigarettes, chewing tobacco, and electronic cigarettes.  If you have any questions. Other tests or screenings that may be performed during your third trimester include:  Blood tests that check for low iron levels (anemia).  Fetal testing to check the health, activity level, and growth of the fetus. Testing is done if you have certain medical conditions or if there are problems during the pregnancy.  Nonstress test (NST). This test checks the health of your baby to make sure there are no signs of problems, such as the baby not getting enough oxygen. During this test, a belt is placed around your belly. The baby is made to move, and its heart rate is monitored during movement. What is false labor? False labor is a condition in which you feel small, irregular tightenings of the muscles in the womb (contractions) that usually go away with rest, changing position, or drinking water. These are called Braxton Hicks contractions. Contractions may last for hours, days, or even weeks before true labor sets in. If contractions come at regular intervals, become more frequent, increase in intensity, or become painful, you should see your health care provider. What are the signs of labor?  Abdominal cramps.  Regular contractions that start at 10 minutes apart and become stronger and more frequent with time.  Contractions that start on the top of the uterus and spread down to the lower abdomen and back.  Increased pelvic pressure and  dull back pain.  A watery or bloody mucus discharge that comes from the vagina.  Leaking of amniotic fluid. This  is also known as your "water breaking." It could be a slow trickle or a gush. Let your health care provider know if it has a color or strange odor. If you have any of these signs, call your health care provider right away, even if it is before your due date. Follow these instructions at home: Medicines  Follow your health care provider's instructions regarding medicine use. Specific medicines may be either safe or unsafe to take during pregnancy.  Take a prenatal vitamin that contains at least 600 micrograms (mcg) of folic acid.  If you develop constipation, try taking a stool softener if your health care provider approves. Eating and drinking   Eat a balanced diet that includes fresh fruits and vegetables, whole grains, good sources of protein such as meat, eggs, or tofu, and low-fat dairy. Your health care provider will help you determine the amount of weight gain that is right for you.  Avoid raw meat and uncooked cheese. These carry germs that can cause birth defects in the baby.  If you have low calcium intake from food, talk to your health care provider about whether you should take a daily calcium supplement.  Eat four or five small meals rather than three large meals a day.  Limit foods that are high in fat and processed sugars, such as fried and sweet foods.  To prevent constipation: ? Drink enough fluid to keep your urine clear or pale yellow. ? Eat foods that are high in fiber, such as fresh fruits and vegetables, whole grains, and beans. Activity  Exercise only as directed by your health care provider. Most women can continue their usual exercise routine during pregnancy. Try to exercise for 30 minutes at least 5 days a week. Stop exercising if you experience uterine contractions.  Avoid heavy lifting.  Do not exercise in extreme heat or humidity, or at high  altitudes.  Wear low-heel, comfortable shoes.  Practice good posture.  You may continue to have sex unless your health care provider tells you otherwise. Relieving pain and discomfort  Take frequent breaks and rest with your legs elevated if you have leg cramps or low back pain.  Take warm sitz baths to soothe any pain or discomfort caused by hemorrhoids. Use hemorrhoid cream if your health care provider approves.  Wear a good support bra to prevent discomfort from breast tenderness.  If you develop varicose veins: ? Wear support pantyhose or compression stockings as told by your healthcare provider. ? Elevate your feet for 15 minutes, 3-4 times a day. Prenatal care  Write down your questions. Take them to your prenatal visits.  Keep all your prenatal visits as told by your health care provider. This is important. Safety  Wear your seat belt at all times when driving.  Make a list of emergency phone numbers, including numbers for family, friends, the hospital, and police and fire departments. General instructions  Avoid cat litter boxes and soil used by cats. These carry germs that can cause birth defects in the baby. If you have a cat, ask someone to clean the litter box for you.  Do not travel far distances unless it is absolutely necessary and only with the approval of your health care provider.  Do not use hot tubs, steam rooms, or saunas.  Do not drink alcohol.  Do not use any products that contain nicotine or tobacco, such as cigarettes and e-cigarettes. If you need help quitting, ask your health care provider.  Do not use any  medicinal herbs or unprescribed drugs. These chemicals affect the formation and growth of the baby.  Do not douche or use tampons or scented sanitary pads.  Do not cross your legs for long periods of time.  To prepare for the arrival of your baby: ? Take prenatal classes to understand, practice, and ask questions about labor and  delivery. ? Make a trial run to the hospital. ? Visit the hospital and tour the maternity area. ? Arrange for maternity or paternity leave through employers. ? Arrange for family and friends to take care of pets while you are in the hospital. ? Purchase a rear-facing car seat and make sure you know how to install it in your car. ? Pack your hospital bag. ? Prepare the babys nursery. Make sure to remove all pillows and stuffed animals from the baby's crib to prevent suffocation.  Visit your dentist if you have not gone during your pregnancy. Use a soft toothbrush to brush your teeth and be gentle when you floss. Contact a health care provider if:  You are unsure if you are in labor or if your water has broken.  You become dizzy.  You have mild pelvic cramps, pelvic pressure, or nagging pain in your abdominal area.  You have lower back pain.  You have persistent nausea, vomiting, or diarrhea.  You have an unusual or bad smelling vaginal discharge.  You have pain when you urinate. Get help right away if:  Your water breaks before 37 weeks.  You have regular contractions less than 5 minutes apart before 37 weeks.  You have a fever.  You are leaking fluid from your vagina.  You have spotting or bleeding from your vagina.  You have severe abdominal pain or cramping.  You have rapid weight loss or weight gain.  You have shortness of breath with chest pain.  You notice sudden or extreme swelling of your face, hands, ankles, feet, or legs.  Your baby makes fewer than 10 movements in 2 hours.  You have severe headaches that do not go away when you take medicine.  You have vision changes. Summary  The third trimester is from week 28 through week 40, months 7 through 9. The third trimester is a time when the unborn baby (fetus) is growing rapidly.  During the third trimester, your discomfort may increase as you and your baby continue to gain weight. You may have abdominal,  leg, and back pain, sleeping problems, and an increased need to urinate.  During the third trimester your breasts will keep growing and they will continue to become tender. A yellow fluid (colostrum) may leak from your breasts. This is the first milk you are producing for your baby.  False labor is a condition in which you feel small, irregular tightenings of the muscles in the womb (contractions) that eventually go away. These are called Braxton Hicks contractions. Contractions may last for hours, days, or even weeks before true labor sets in.  Signs of labor can include: abdominal cramps; regular contractions that start at 10 minutes apart and become stronger and more frequent with time; watery or bloody mucus discharge that comes from the vagina; increased pelvic pressure and dull back pain; and leaking of amniotic fluid. This information is not intended to replace advice given to you by your health care provider. Make sure you discuss any questions you have with your health care provider. Document Released: 04/28/2001 Document Revised: 08/25/2018 Document Reviewed: 06/09/2016 Elsevier Patient Education  2020 [employer].  Preeclampsia and Eclampsia Preeclampsia is a serious condition that may develop during pregnancy. This condition causes high blood pressure and increased protein in your urine along with other symptoms, such as headaches and vision changes. These symptoms may develop as the condition gets worse. Preeclampsia may occur at 20 weeks of pregnancy or later. Diagnosing and treating preeclampsia early is very important. If not treated early, it can cause serious problems for you and your baby. One problem it can lead to is eclampsia. Eclampsia is a condition that causes muscle jerking or shaking (convulsions or seizures) and other serious problems for the mother. During pregnancy, delivering your baby may be the best treatment for preeclampsia or eclampsia. For most women,  preeclampsia and eclampsia symptoms go away after giving birth. In rare cases, a woman may develop preeclampsia after giving birth (postpartum preeclampsia). This usually occurs within 48 hours after childbirth but may occur up to 6 weeks after giving birth. What are the causes? The cause of preeclampsia is not known. What increases the risk? The following risk factors make you more likely to develop preeclampsia:  Being pregnant for the first time.  Having had preeclampsia during a past pregnancy.  Having a family history of preeclampsia.  Having high blood pressure.  Being pregnant with more than one baby.  Being 8 or older.  Being African-American.  Having kidney disease or diabetes.  Having medical conditions such as lupus or blood diseases.  Being very overweight (obese). What are the signs or symptoms? The most common symptoms are:  Severe headaches.  Vision problems, such as blurred or double vision.  Abdominal pain, especially upper abdominal pain. Other symptoms that may develop as the condition gets worse include:  Sudden weight gain.  Sudden swelling of the hands, face, legs, and feet.  Severe nausea and vomiting.  Numbness in the face, arms, legs, and feet.  Dizziness.  Urinating less than usual.  Slurred speech.  Convulsions or seizures. How is this diagnosed? There are no screening tests for preeclampsia. Your health care provider will ask you about symptoms and check for signs of preeclampsia during your prenatal visits. You may also have tests that include:  Checking your blood pressure.  Urine tests to check for protein. Your health care provider will check for this at every prenatal visit.  Blood tests.  Monitoring your baby's heart rate.  Ultrasound. How is this treated? You and your health care provider will determine the treatment approach that is best for you. Treatment may include:  Having more frequent prenatal exams to check  for signs of preeclampsia, if you have an increased risk for preeclampsia.  Medicine to lower your blood pressure.  Staying in the hospital, if your condition is severe. There, treatment will focus on controlling your blood pressure and the amount of fluids in your body (fluid retention).  Taking medicine (magnesium sulfate) to prevent seizures. This may be given as an injection or through an IV.  Taking a low-dose aspirin during your pregnancy.  Delivering your baby early. You may have your labor started with medicine (induced), or you may have a cesarean delivery. Follow these instructions at home: Eating and drinking   Drink enough fluid to keep your urine pale yellow.  Avoid caffeine. Lifestyle  Do not use any products that contain nicotine or tobacco, such as cigarettes and e-cigarettes. If you need help quitting, ask your health care provider.  Do not use alcohol or drugs.  Avoid stress as much as possible. Rest and  get plenty of sleep. General instructions  Take over-the-counter and prescription medicines only as told by your health care provider.  When lying down, lie on your left side. This keeps pressure off your major blood vessels.  When sitting or lying down, raise (elevate) your feet. Try putting some pillows underneath your lower legs.  Exercise regularly. Ask your health care provider what kinds of exercise are best for you.  Keep all follow-up and prenatal visits as told by your health care provider. This is important. How is this prevented? There is no known way of preventing preeclampsia or eclampsia from developing. However, to lower your risk of complications and detect problems early:  Get regular prenatal care. Your health care provider may be able to diagnose and treat the condition early.  Maintain a healthy weight. Ask your health care provider for help managing weight gain during pregnancy.  Work with your health care provider to manage any  long-term (chronic) health conditions you have, such as diabetes or kidney problems.  You may have tests of your blood pressure and kidney function after giving birth.  Your health care provider may have you take low-dose aspirin during your next pregnancy. Contact a health care provider if:  You have symptoms that your health care provider told you may require more treatment or monitoring, such as: ? Headaches. ? Nausea or vomiting. ? Abdominal pain. ? Dizziness. ? Light-headedness. Get help right away if:  You have severe: ? Abdominal pain. ? Headaches that do not get better. ? Dizziness. ? Vision problems. ? Confusion. ? Nausea or vomiting.  You have any of the following: ? A seizure. ? Sudden, rapid weight gain. ? Sudden swelling in your hands, ankles, or face. ? Trouble moving any part of your body. ? Numbness in any part of your body. ? Trouble speaking. ? Abnormal bleeding.  You faint. Summary  Preeclampsia is a serious condition that may develop during pregnancy.  This condition causes high blood pressure and increased protein in your urine along with other symptoms, such as headaches and vision changes.  Diagnosing and treating preeclampsia early is very important. If not treated early, it can cause serious problems for you and your baby.  Get help right away if you have symptoms that your health care provider told you to watch for. This information is not intended to replace advice given to you by your health care provider. Make sure you discuss any questions you have with your health care provider. Document Released: 05/01/2000 Document Revised: 01/04/2018 Document Reviewed: 12/09/2015 Elsevier Patient Education  2020 [employer].   Contraception Choices - [email or web address] Contraception, also called birth control, refers to methods or devices that prevent pregnancy. Hormonal methods Contraceptive implant  A contraceptive implant is a thin, plastic tube  that contains a hormone. It is inserted into the upper part of the arm. It can remain in place for up to 3 years. Progestin-only injections Progestin-only injections are injections of progestin, a synthetic form of the hormone progesterone. They are given every 3 months by a health care provider. Birth control pills  Birth control pills are pills that contain hormones that prevent pregnancy. They must be taken once a day, preferably at the same time each day. Birth control patch  The birth control patch contains hormones that prevent pregnancy. It is placed on the skin and must be changed once a week for three weeks and removed on the fourth week. A prescription is needed to use this method of contraception.  Vaginal ring  A vaginal ring contains hormones that prevent pregnancy. It is placed in the vagina for three weeks and removed on the fourth week. After that, the process is repeated with a new ring. A prescription is needed to use this method of contraception. Emergency contraceptive Emergency contraceptives prevent pregnancy after unprotected sex. They come in pill form and can be taken up to 5 days after sex. They work best the sooner they are taken after having sex. Most emergency contraceptives are available without a prescription. This method should not be used as your only form of birth control. Barrier methods Female condom  A female condom is a thin sheath that is worn over the penis during sex. Condoms keep sperm from going inside a woman's body. They can be used with a spermicide to increase their effectiveness. They should be disposed after a single use. Female condom  A female condom is a soft, loose-fitting sheath that is put into the vagina before sex. The condom keeps sperm from going inside a woman's body. They should be disposed after a single use. Diaphragm  A diaphragm is a soft, dome-shaped barrier. It is inserted into the vagina before sex, along with a spermicide. The  diaphragm blocks sperm from entering the uterus, and the spermicide kills sperm. A diaphragm should be left in the vagina for 6-8 hours after sex and removed within 24 hours. A diaphragm is prescribed and fitted by a health care provider. A diaphragm should be replaced every 1-2 years, after giving birth, after gaining more than 15 lb (6.8 kg), and after pelvic surgery. Cervical cap  A cervical cap is a round, soft latex or plastic cup that fits over the cervix. It is inserted into the vagina before sex, along with spermicide. It blocks sperm from entering the uterus. The cap should be left in place for 6-8 hours after sex and removed within 48 hours. A cervical cap must be prescribed and fitted by a health care provider. It should be replaced every 2 years. Sponge  A sponge is a soft, circular piece of polyurethane foam with spermicide on it. The sponge helps block sperm from entering the uterus, and the spermicide kills sperm. To use it, you make it wet and then insert it into the vagina. It should be inserted before sex, left in for at least 6 hours after sex, and removed and thrown away within 30 hours. Spermicides Spermicides are chemicals that kill or block sperm from entering the cervix and uterus. They can come as a cream, jelly, suppository, foam, or tablet. A spermicide should be inserted into the vagina with an applicator at least 10-15 minutes before sex to allow time for it to work. The process must be repeated every time you have sex. Spermicides do not require a prescription. Intrauterine contraception Intrauterine device (IUD) An IUD is a T-shaped device that is put in a woman's uterus. There are two types:  Hormone IUD.This type contains progestin, a synthetic form of the hormone progesterone. This type can stay in place for 3-5 years.  Copper IUD.This type is wrapped in copper wire. It can stay in place for 10 years.  Permanent methods of contraception Female tubal ligation In  this method, a woman's fallopian tubes are sealed, tied, or blocked during surgery to prevent eggs from traveling to the uterus. Hysteroscopic sterilization In this method, a small, flexible insert is placed into each fallopian tube. The inserts cause scar tissue to form in the fallopian tubes and  block them, so sperm cannot reach an egg. The procedure takes about 3 months to be effective. Another form of birth control must be used during those 3 months. Female sterilization This is a procedure to tie off the tubes that carry sperm (vasectomy). After the procedure, the man can still ejaculate fluid (semen). Natural planning methods Natural family planning In this method, a couple does not have sex on days when the woman could become pregnant. Calendar method This means keeping track of the length of each menstrual cycle, identifying the days when pregnancy can happen, and not having sex on those days. Ovulation method In this method, a couple avoids sex during ovulation. Symptothermal method This method involves not having sex during ovulation. The woman typically checks for ovulation by watching changes in her temperature and in the consistency of cervical mucus. Post-ovulation method In this method, a couple waits to have sex until after ovulation. Summary  Contraception, also called birth control, means methods or devices that prevent pregnancy.  Hormonal methods of contraception include implants, injections, pills, patches, vaginal rings, and emergency contraceptives.  Barrier methods of contraception can include female condoms, female condoms, diaphragms, cervical caps, sponges, and spermicides.  There are two types of IUDs (intrauterine devices). An IUD can be put in a woman's uterus to prevent pregnancy for 3-5 years.  Permanent sterilization can be done through a procedure for males, females, or both.  Natural family planning methods involve not having sex on days when the woman could  become pregnant. This information is not intended to replace advice given to you by your health care provider. Make sure you discuss any questions you have with your health care provider. Document Released: 05/04/2005 Document Revised: 05/06/2017 Document Reviewed: 06/06/2016 Elsevier Patient Education  2020 [employer].

## 2019-03-28 ENCOUNTER — Other Ambulatory Visit: Payer: Self-pay

## 2019-03-28 ENCOUNTER — Ambulatory Visit (INDEPENDENT_AMBULATORY_CARE_PROVIDER_SITE_OTHER): Payer: Medicaid Other | Admitting: Student

## 2019-03-28 ENCOUNTER — Other Ambulatory Visit (HOSPITAL_COMMUNITY)
Admission: RE | Admit: 2019-03-28 | Discharge: 2019-03-28 | Disposition: A | Discharge status: Home / Self Care | Payer: Medicaid Other | Source: Ambulatory Visit | Attending: Student | Admitting: Student

## 2019-03-28 VITALS — BP 117/78 | HR 94 | Wt 241.8 lb

## 2019-03-28 DIAGNOSIS — Z3493 Encounter for supervision of normal pregnancy, unspecified, third trimester: Secondary | ICD-10-CM

## 2019-03-28 DIAGNOSIS — Z23 Encounter for immunization: Secondary | ICD-10-CM

## 2019-03-28 DIAGNOSIS — Z3A36 36 weeks gestation of pregnancy: Secondary | ICD-10-CM | POA: Diagnosis not present

## 2019-03-28 NOTE — Patient Instructions (Signed)
Preeclampsia and Eclampsia °Preeclampsia is a serious condition that may develop during pregnancy. This condition causes high blood pressure and increased protein in your urine along with other symptoms, such as headaches and vision changes. These symptoms may develop as the condition gets worse. Preeclampsia may occur at 20 weeks of pregnancy or later. °Diagnosing and treating preeclampsia early is very important. If not treated early, it can cause serious problems for you and your baby. One problem it can lead to is eclampsia. Eclampsia is a condition that causes muscle jerking or shaking (convulsions or seizures) and other serious problems for the mother. During pregnancy, delivering your baby may be the best treatment for preeclampsia or eclampsia. For most women, preeclampsia and eclampsia symptoms go away after giving birth. °In rare cases, a woman may develop preeclampsia after giving birth (postpartum preeclampsia). This usually occurs within 48 hours after childbirth but may occur up to 6 weeks after giving birth. °What are the causes? °The cause of preeclampsia is not known. °What increases the risk? °The following risk factors make you more likely to develop preeclampsia: °· Being pregnant for the first time. °· Having had preeclampsia during a past pregnancy. °· Having a family history of preeclampsia. °· Having high blood pressure. °· Being pregnant with more than one baby. °· Being 35 or older. °· Being African-American. °· Having kidney disease or diabetes. °· Having medical conditions such as lupus or blood diseases. °· Being very overweight (obese). °What are the signs or symptoms? °The most common symptoms are: °· Severe headaches. °· Vision problems, such as blurred or double vision. °· Abdominal pain, especially upper abdominal pain. °Other symptoms that may develop as the condition gets worse include: °· Sudden weight gain. °· Sudden swelling of the hands, face, legs, and feet. °· Severe nausea  and vomiting. °· Numbness in the face, arms, legs, and feet. °· Dizziness. °· Urinating less than usual. °· Slurred speech. °· Convulsions or seizures. °How is this diagnosed? °There are no screening tests for preeclampsia. Your health care provider will ask you about symptoms and check for signs of preeclampsia during your prenatal visits. You may also have tests that include: °· Checking your blood pressure. °· Urine tests to check for protein. Your health care provider will check for this at every prenatal visit. °· Blood tests. °· Monitoring your baby's heart rate. °· Ultrasound. °How is this treated? °You and your health care provider will determine the treatment approach that is best for you. Treatment may include: °· Having more frequent prenatal exams to check for signs of preeclampsia, if you have an increased risk for preeclampsia. °· Medicine to lower your blood pressure. °· Staying in the hospital, if your condition is severe. There, treatment will focus on controlling your blood pressure and the amount of fluids in your body (fluid retention). °· Taking medicine (magnesium sulfate) to prevent seizures. This may be given as an injection or through an IV. °· Taking a low-dose aspirin during your pregnancy. °· Delivering your baby early. You may have your labor started with medicine (induced), or you may have a cesarean delivery. °Follow these instructions at home: °Eating and drinking ° °· Drink enough fluid to keep your urine pale yellow. °· Avoid caffeine. °Lifestyle °· Do not use any products that contain nicotine or tobacco, such as cigarettes and e-cigarettes. If you need help quitting, ask your health care provider. °· Do not use alcohol or drugs. °· Avoid stress as much as possible. Rest and get   plenty of sleep. °General instructions °· Take over-the-counter and prescription medicines only as told by your health care provider. °· When lying down, lie on your left side. This keeps pressure off your  major blood vessels. °· When sitting or lying down, raise (elevate) your feet. Try putting some pillows underneath your lower legs. °· Exercise regularly. Ask your health care provider what kinds of exercise are best for you. °· Keep all follow-up and prenatal visits as told by your health care provider. This is important. °How is this prevented? °There is no known way of preventing preeclampsia or eclampsia from developing. However, to lower your risk of complications and detect problems early: °· Get regular prenatal care. Your health care provider may be able to diagnose and treat the condition early. °· Maintain a healthy weight. Ask your health care provider for help managing weight gain during pregnancy. °· Work with your health care provider to manage any long-term (chronic) health conditions you have, such as diabetes or kidney problems. °· You may have tests of your blood pressure and kidney function after giving birth. °· Your health care provider may have you take low-dose aspirin during your next pregnancy. °Contact a health care provider if: °· You have symptoms that your health care provider told you may require more treatment or monitoring, such as: °? Headaches. °? Nausea or vomiting. °? Abdominal pain. °? Dizziness. °? Light-headedness. °Get help right away if: °· You have severe: °? Abdominal pain. °? Headaches that do not get better. °? Dizziness. °? Vision problems. °? Confusion. °? Nausea or vomiting. °· You have any of the following: °? A seizure. °? Sudden, rapid weight gain. °? Sudden swelling in your hands, ankles, or face. °? Trouble moving any part of your body. °? Numbness in any part of your body. °? Trouble speaking. °? Abnormal bleeding. °· You faint. °Summary °· Preeclampsia is a serious condition that may develop during pregnancy. °· This condition causes high blood pressure and increased protein in your urine along with other symptoms, such as headaches and vision  changes. °· Diagnosing and treating preeclampsia early is very important. If not treated early, it can cause serious problems for you and your baby. °· Get help right away if you have symptoms that your health care provider told you to watch for. °This information is not intended to replace advice given to you by your health care provider. Make sure you discuss any questions you have with your health care provider. °Document Released: 05/01/2000 Document Revised: 01/04/2018 Document Reviewed: 12/09/2015 °Elsevier Patient Education © 2020 [employer] Inc[employer] ° °

## 2019-03-28 NOTE — Progress Notes (Signed)
   PRENATAL VISIT NOTE  Subjective:  Cassidy Gutierrez is a 28 y.o. G2P0010 at [redacted]w[redacted]d being seen today for ongoing prenatal care.  She is currently monitored for the following issues for this low-risk pregnancy and has Supervision of low-risk pregnancy; Sickle cell trait ([place]); Insufficient prenatal care; Maternal obesity affecting pregnancy, antepartum; UTI (urinary tract infection) during pregnancy, first trimester; and Anemia on their problem list.  Patient reports no complaints.  Contractions: Irritability. Vag. Bleeding: None.  Movement: Present. Denies leaking of fluid.   The following portions of the patient's history were reviewed and updated as appropriate: allergies, current medications, past family history, past medical history, past social history, past surgical history and problem list.   Objective:   Vitals:   03/28/19 1339  BP: 117/78  Pulse: 94  Weight: 241 lb 12.8 oz (109.7 kg)    Fetal Status: Fetal Heart Rate (bpm): 130   Movement: Present     General:  Alert, oriented and cooperative. Patient is in no acute distress.  Skin: Skin is warm and dry. No rash noted.   Cardiovascular: Normal heart rate noted  Respiratory: Normal respiratory effort, no problems with respiration noted  Abdomen: Soft, gravid, appropriate for gestational age.  Pain/Pressure: Present     Pelvic: Cervical exam deferred        Extremities: Normal range of motion.  Edema: Trace  Mental Status: Normal mood and affect. Normal behavior. Normal judgment and thought content.   Assessment and Plan:  Pregnancy: G2P0010 at [redacted]w[redacted]d 1. Encounter for supervision of low-risk pregnancy in third trimester -Confirmed  in detail with patient that she is taking her BP correctly at home. Since learning how to take her BP in early October, her BPs have improved, see earlier progress notes on this issue.  Patient reiterates that she was just placing the cuff "all over" her arm and that she didn't "know what she was doing".  She says that her BP is now normal once she has learned how to take her BP. She never went and got the larger cuff size.  Patient agrees to have BP check via RN visit over the phone in one week and then provider visit in two weeks.   -[facility] today by [place].  - Culture, beta strep (group b only) - GC/Chlamydia probe amp ([place])not at [facility]  Preterm labor symptoms and general obstetric precautions including but not limited to vaginal bleeding, contractions, leaking of fluid and fetal movement were reviewed in detail with the patient. Please refer to After Visit Summary for other counseling recommendations.   Return in about 1 week (around 04/04/2019), or BP check with nurse visit over the phone in 1 week and then My Chart visit at 38 weeks..  Future Appointments  Date Time Provider [place]  04/04/2019  9:20 AM Theresia Bough [place]  04/12/2019  4:15 PM [address], [place]    [place], [place]

## 2019-03-29 LAB — GC/CHLAMYDIA PROBE AMP (~~LOC~~) NOT AT ARMC
Chlamydia: NEGATIVE
Comment: NEGATIVE
Comment: NORMAL
Neisseria Gonorrhea: NEGATIVE

## 2019-04-01 LAB — OB RESULTS CONSOLE GBS: GBS: NEGATIVE

## 2019-04-01 LAB — CULTURE, BETA STREP (GROUP B ONLY): Strep Gp B Culture: NEGATIVE

## 2019-04-03 ENCOUNTER — Telehealth: Payer: Self-pay | Admitting: Family Medicine

## 2019-04-03 NOTE — Telephone Encounter (Signed)
Spoke to patient about her appointment on 11/17 @ 9:20. Patient instructed that this visit will be a mychart visit and she does not have to come to the office for this appointment. Patient instructed a nurse will be call her around her appointment time. Patient instructed to be available around her appointment. Patient verbalized understanding. Patient has BP cuff at home.

## 2019-04-04 ENCOUNTER — Telehealth: Payer: Medicaid Other

## 2019-04-06 ENCOUNTER — Other Ambulatory Visit: Payer: Self-pay

## 2019-04-06 ENCOUNTER — Inpatient Hospital Stay (HOSPITAL_COMMUNITY)
Admission: AD | Admit: 2019-04-06 | Discharge: 2019-04-10 | DRG: 807 | Disposition: A | Discharge status: Home / Self Care | Payer: Medicaid Other | Attending: Family Medicine | Admitting: Family Medicine

## 2019-04-06 ENCOUNTER — Telehealth: Payer: Self-pay

## 2019-04-06 ENCOUNTER — Encounter (HOSPITAL_COMMUNITY): Payer: Self-pay | Admitting: *Deleted

## 2019-04-06 DIAGNOSIS — D573 Sickle-cell trait: Secondary | ICD-10-CM | POA: Diagnosis present

## 2019-04-06 DIAGNOSIS — O9902 Anemia complicating childbirth: Secondary | ICD-10-CM | POA: Diagnosis present

## 2019-04-06 DIAGNOSIS — Z3A37 37 weeks gestation of pregnancy: Secondary | ICD-10-CM | POA: Diagnosis not present

## 2019-04-06 DIAGNOSIS — Z87891 Personal history of nicotine dependence: Secondary | ICD-10-CM | POA: Diagnosis not present

## 2019-04-06 DIAGNOSIS — O134 Gestational [pregnancy-induced] hypertension without significant proteinuria, complicating childbirth: Principal | ICD-10-CM | POA: Diagnosis present

## 2019-04-06 DIAGNOSIS — O99214 Obesity complicating childbirth: Secondary | ICD-10-CM | POA: Diagnosis present

## 2019-04-06 DIAGNOSIS — Z20828 Contact with and (suspected) exposure to other viral communicable diseases: Secondary | ICD-10-CM | POA: Diagnosis present

## 2019-04-06 DIAGNOSIS — O133 Gestational [pregnancy-induced] hypertension without significant proteinuria, third trimester: Secondary | ICD-10-CM | POA: Diagnosis not present

## 2019-04-06 DIAGNOSIS — O99210 Obesity complicating pregnancy, unspecified trimester: Secondary | ICD-10-CM

## 2019-04-06 DIAGNOSIS — Z3493 Encounter for supervision of normal pregnancy, unspecified, third trimester: Secondary | ICD-10-CM

## 2019-04-06 DIAGNOSIS — O0933 Supervision of pregnancy with insufficient antenatal care, third trimester: Secondary | ICD-10-CM

## 2019-04-06 DIAGNOSIS — O2341 Unspecified infection of urinary tract in pregnancy, first trimester: Secondary | ICD-10-CM

## 2019-04-06 DIAGNOSIS — O139 Gestational [pregnancy-induced] hypertension without significant proteinuria, unspecified trimester: Secondary | ICD-10-CM | POA: Diagnosis present

## 2019-04-06 LAB — CBC
HCT: 28.3 % — ABNORMAL LOW (ref 36.0–46.0)
Hemoglobin: 9.2 g/dL — ABNORMAL LOW (ref 12.0–15.0)
MCH: 23.9 pg — ABNORMAL LOW (ref 26.0–34.0)
MCHC: 32.5 g/dL (ref 30.0–36.0)
MCV: 73.5 fL — ABNORMAL LOW (ref 80.0–100.0)
Platelets: 198 10*3/uL (ref 150–400)
RBC: 3.85 MIL/uL — ABNORMAL LOW (ref 3.87–5.11)
RDW: 17.3 % — ABNORMAL HIGH (ref 11.5–15.5)
WBC: 7.3 10*3/uL (ref 4.0–10.5)
nRBC: 0 % (ref 0.0–0.2)

## 2019-04-06 LAB — COMPREHENSIVE METABOLIC PANEL
ALT: 12 U/L (ref 0–44)
AST: 12 U/L — ABNORMAL LOW (ref 15–41)
Albumin: 2.3 g/dL — ABNORMAL LOW (ref 3.5–5.0)
Alkaline Phosphatase: 180 U/L — ABNORMAL HIGH (ref 38–126)
Anion gap: 8 (ref 5–15)
BUN: 6 mg/dL (ref 6–20)
CO2: 21 mmol/L — ABNORMAL LOW (ref 22–32)
Calcium: 8.5 mg/dL — ABNORMAL LOW (ref 8.9–10.3)
Chloride: 108 mmol/L (ref 98–111)
Creatinine, Ser: 0.65 mg/dL (ref 0.44–1.00)
GFR calc Af Amer: >60 mL/min (ref >60)
GFR calc non Af Amer: >60 mL/min (ref >60)
Glucose, Bld: 102 mg/dL — ABNORMAL HIGH (ref 70–99)
Potassium: 3.7 mmol/L (ref 3.5–5.1)
Sodium: 137 mmol/L (ref 135–145)
Total Bilirubin: 0.6 mg/dL (ref 0.3–1.2)
Total Protein: 5.5 g/dL — ABNORMAL LOW (ref 6.5–8.1)

## 2019-04-06 LAB — URINALYSIS, ROUTINE W REFLEX MICROSCOPIC
Bilirubin Urine: NEGATIVE
Glucose, UA: NEGATIVE mg/dL
Hgb urine dipstick: NEGATIVE
Ketones, ur: NEGATIVE mg/dL
Leukocytes,Ua: NEGATIVE
Nitrite: NEGATIVE
Protein, ur: NEGATIVE mg/dL
Specific Gravity, Urine: 1.01 (ref 1.005–1.030)
pH: 6 (ref 5.0–8.0)

## 2019-04-06 LAB — ABO/RH: ABO/RH(D): A POS

## 2019-04-06 LAB — TYPE AND SCREEN
ABO/RH(D): A POS
Antibody Screen: NEGATIVE

## 2019-04-06 LAB — PROTEIN / CREATININE RATIO, URINE
Creatinine, Urine: 66.99 mg/dL
Protein Creatinine Ratio: 0.15 mg/mg{Cre} (ref 0.00–0.15)
Total Protein, Urine: 10 mg/dL

## 2019-04-06 MED ORDER — FLEET ENEMA 7-19 GM/118ML RE ENEM: 1.0000 | ENEMA | RECTAL | Status: DC | PRN

## 2019-04-06 MED ORDER — MISOPROSTOL 50MCG HALF TABLET
50.0000 ug | ORAL_TABLET | ORAL | Status: DC | PRN
  Administered 2019-04-06 – 2019-04-07 (×4): 50 ug via ORAL
  Filled 2019-04-06 (×4): qty 1

## 2019-04-06 MED ORDER — ACETAMINOPHEN 325 MG PO TABS: 650.0000 mg | ORAL_TABLET | ORAL | Status: DC | PRN

## 2019-04-06 MED ORDER — OXYTOCIN 40 UNITS IN NORMAL SALINE INFUSION - SIMPLE MED: 2.5000 [IU]/h | INTRAVENOUS | Status: DC

## 2019-04-06 MED ORDER — OXYTOCIN BOLUS FROM INFUSION
500.0000 mL | Freq: Once | INTRAVENOUS | Status: AC
  Administered 2019-04-08: 500 mL via INTRAVENOUS

## 2019-04-06 MED ORDER — LACTATED RINGERS IV SOLN
INTRAVENOUS | Status: DC
  Administered 2019-04-06 – 2019-04-08 (×6): via INTRAVENOUS

## 2019-04-06 MED ORDER — LIDOCAINE HCL (PF) 1 % IJ SOLN
30.0000 mL | INTRAMUSCULAR | Status: AC | PRN
  Administered 2019-04-08: 30 mL via SUBCUTANEOUS
  Filled 2019-04-06 (×2): qty 30

## 2019-04-06 MED ORDER — ONDANSETRON HCL 4 MG/2ML IJ SOLN: 4.0000 mg | Freq: Four times a day (QID) | INTRAMUSCULAR | Status: DC | PRN

## 2019-04-06 MED ORDER — TERBUTALINE SULFATE 1 MG/ML IJ SOLN: 0.2500 mg | Freq: Once | INTRAMUSCULAR | Status: DC | PRN

## 2019-04-06 MED ORDER — SOD CITRATE-CITRIC ACID 500-334 MG/5ML PO SOLN: 30.0000 mL | ORAL | Status: DC | PRN

## 2019-04-06 MED ORDER — LACTATED RINGERS IV SOLN
500.0000 mL | INTRAVENOUS | Status: DC | PRN
  Administered 2019-04-08 (×2): 500 mL via INTRAVENOUS

## 2019-04-06 NOTE — H&P (Addendum)
LABOR AND DELIVERY ADMISSION HISTORY AND PHYSICAL NOTE  Cassidy Gutierrez is a 28 y.o. female G2P0010 with IUP at 60w4dby [place]@ 9w presenting for gHTN. Was normal ranges at visit 11/10 (117/78). babyscripts reported 139/91 on 11/18. Upon admission 11/19, pressures are 150/85 and 149/87. Denies headache, vision changes, RUQ pain, SOB. Positive bilateral LE swelling. She reports positive fetal movement. She denies leakage of fluid or vaginal bleeding. Contractions- none.  Prenatal History/Complications: PNC at [facility] Pregnancy complications:  - sickle cell trait - anemia  Past Medical History: Past Medical History:  Diagnosis Date  . BV (bacterial vaginosis)     Past Surgical History: Past Surgical History:  Procedure Laterality Date  . NO PAST SURGERIES      Obstetrical History: OB History    Gravida  2   Para      Term      Preterm      AB  1   Living        SAB      TAB  1   Ectopic      Multiple      Live Births              Social History: Social History   Socioeconomic History  . Marital status: Significant Other    Spouse name: Not on file  . Number of children: Not on file  . Years of education: Not on file  . Highest education level: Not on file  Occupational History  . Not on file  Social Needs  . Financial resource strain: Not on file  . Food insecurity    Worry: Sometimes true    Inability: Sometimes true  . Transportation needs    Medical: No    Non-medical: No  Tobacco Use  . Smoking status: Former SResearch scientist (life sciences) . Smokeless tobacco: Never Used  . Tobacco comment: hookah  Substance and Sexual Activity  . Alcohol use: Not Currently    Comment: not during pregnancy  . Drug use: Not Currently  . Sexual activity: Yes    Birth control/protection: None  Lifestyle  . Physical activity    Days per week: Not on file    Minutes per session: Not on file  . Stress: Not on file  Relationships  . Social [place] phone: Not on  file    Gets together: Not on file    Attends religious service: Not on file    Active member of club or organization: Not on file    Attends meetings of clubs or organizations: Not on file    Relationship status: Not on file  Other Topics Concern  . Not on file  Social History Narrative  . Not on file   Family History: Family History  Problem Relation Age of Onset  . Breast cancer Mother   . Diabetes Mother    Allergies: No Known Allergies  Medications Prior to Admission  Medication Sig Dispense Refill Last Dose  . acetaminophen (TYLENOL) 500 MG tablet Take 500 mg by mouth every 6 (six) hours as needed.   04/05/2019 at Unknown time  . aspirin (ASPIRIN 81) 81 MG EC tablet Take 1 tablet (81 mg total) by mouth daily. Swallow whole. 30 tablet 12 04/06/2019 at Unknown time  . ferrous fumarate (HEMOCYTE - 106 MG FE) 325 (106 Fe) MG TABS tablet Take 1 tablet (106 mg of iron total) by mouth daily. 60 tablet 0 04/06/2019 at Unknown time  . Prenatal  Vit-Fe Fumarate-FA (MULTIVITAMIN-PRENATAL) 27-0.8 MG TABS tablet Take 1 tablet by mouth daily at 12 noon.   04/06/2019 at Unknown time  . Blood Pressure Monitoring (BLOOD PRESSURE KIT) DEVI 1 Device by Does not apply route as needed. ICD 10 Z 1 Device 0     Review of Systems  All systems reviewed and negative except as stated in HPI  Physical Exam Blood pressure (!) 150/85, pulse 97, temperature 98.4 F (36.9 C). General appearance: alert, oriented, NAD Lungs: normal respiratory effort Heart: regular rate Abdomen: soft, non-tender; gravid, FH appropriate for GA Extremities: No calf swelling or tenderness Presentation: cephalic Fetal monitoring: 135bpm, pos accels, neg decels, cat I Uterine activity: none    Prenatal labs: ABO, Rh: A/Positive/-- (05/04 0000) Antibody:  negative Rubella:  immune RPR: Non Reactive (10/13 1621)  HBsAg: Negative, Negative (05/04 0000)  HIV: Non Reactive (10/13 1621)  GC/Chlamydia: neg/neg GBS:  Negative/-- (11/10 1447)  2-hr GTT: neg Genetic screening:  Low risk, AFP neg Anatomy US: normal  Prenatal Transfer Tool  Maternal Diabetes: No Genetic Screening: Normal Maternal Ultrasounds/Referrals: Normal Fetal Ultrasounds or other Referrals:  None Maternal Substance Abuse:  No Significant Maternal Medications:  None Significant Maternal Lab Results: Group B Strep negative  Results for orders placed or performed during the hospital encounter of 04/06/19 (from the past 24 hour(s))  Urinalysis, Routine w reflex microscopic   Collection Time: 04/06/19  7:47 PM  Result Value Ref Range   Color, Urine YELLOW YELLOW   APPearance HAZY (A) CLEAR   Specific Gravity, Urine 1.010 1.005 - 1.030   pH 6.0 5.0 - 8.0   Glucose, UA NEGATIVE NEGATIVE mg/dL   Hgb urine dipstick NEGATIVE NEGATIVE   Bilirubin Urine NEGATIVE NEGATIVE   Ketones, ur NEGATIVE NEGATIVE mg/dL   Protein, ur NEGATIVE NEGATIVE mg/dL   Nitrite NEGATIVE NEGATIVE   Leukocytes,Ua NEGATIVE NEGATIVE    Patient Active Problem List   Diagnosis Date Noted  . Gestational hypertension, third trimester 04/06/2019  . Anemia 03/03/2019  . UTI (urinary tract infection) during pregnancy, first trimester 02/06/2019  . Sickle cell trait ([place]) 12/29/2018  . Insufficient prenatal care 12/29/2018  . Maternal obesity affecting pregnancy, antepartum 12/29/2018  . Supervision of low-risk pregnancy 12/19/2018    Assessment: Cassidy Gutierrez is a 28 y.o. G2P0010 at 58w4dhere for IOL for gHTN. BP currently 135/81. Labs pending and asymptomatic. Discussed options for augmenting labor with patient and based on cervical exam will start with cytotec and consider FB at next check. Patient would like to attempt to deliver without pain meds.   F/u on PEC labs  -hgb 9.2, platelets 198  - urine Cr/Protein 0.15 #Labor: augment with cytotec, consider FB.  #Pain: None, patient considering options #FWB: Cat I #ID:  GBS neg, COVID pending #MOF:  breast #MOC:undecided #Circ:  yes  CDoristine Mango DO PGY-2 FM 04/06/2019, 8:27 PM    OB FELLOW ATTESTATION  I have seen and examined this patient and agree with above documentation in the resident's note except as noted below.  28y/o G2P0 here for IOL for gHTN, PreE labs unremarkable and patient asymptomatic. COVID swab negative. Per our discussion would like outpatient circ, planning on condoms for birth control. IOL as above, recommended FB but patient prefers to wait until next exam.   MAugustin Coupe MD/MPH OParkridge Valley Adult ServicesFellow  04/07/2019

## 2019-04-06 NOTE — MAU Note (Signed)
covid swab obtained without difficulty and pt tol well. No symptoms °

## 2019-04-06 NOTE — MAU Note (Signed)
Pt had elevated B/P this morning. Told to come to MAU to get evaluated. Pt denies any headache or visual changes Reports some swelling in her feet. Good fetal movement felt.

## 2019-04-06 NOTE — Telephone Encounter (Signed)
Babyscripts called about a elevated pressure @1130pm  of 139/91. States patient did not report anything last night. Called patient @811am  she stated that she feels fine and that she only checked it last night because she was up. Asked patient if she could take it for me now and her pressure this morning was 152/85 pulse 87 she states she feels fine this A.M also. Advised patient that she should go to the MAU to be evaluated. Patient verbalized understanding and stated she would head over now. Called MAU to advise of her arrival.

## 2019-04-07 ENCOUNTER — Inpatient Hospital Stay (HOSPITAL_COMMUNITY): Payer: Medicaid Other | Admitting: Anesthesiology

## 2019-04-07 DIAGNOSIS — O134 Gestational [pregnancy-induced] hypertension without significant proteinuria, complicating childbirth: Secondary | ICD-10-CM | POA: Diagnosis not present

## 2019-04-07 LAB — CBC
HCT: 30.2 % — ABNORMAL LOW (ref 36.0–46.0)
HCT: 31.5 % — ABNORMAL LOW (ref 36.0–46.0)
Hemoglobin: 9.6 g/dL — ABNORMAL LOW (ref 12.0–15.0)
Hemoglobin: 10.3 g/dL — ABNORMAL LOW (ref 12.0–15.0)
MCH: 23.5 pg — ABNORMAL LOW (ref 26.0–34.0)
MCH: 24 pg — ABNORMAL LOW (ref 26.0–34.0)
MCHC: 31.8 g/dL (ref 30.0–36.0)
MCHC: 32.7 g/dL (ref 30.0–36.0)
MCV: 74 fL — ABNORMAL LOW (ref 80.0–100.0)
MCV: 73.4 fL — ABNORMAL LOW (ref 80.0–100.0)
Platelets: 216 10*3/uL (ref 150–400)
Platelets: 200 10*3/uL (ref 150–400)
RBC: 4.08 MIL/uL (ref 3.87–5.11)
RBC: 4.29 MIL/uL (ref 3.87–5.11)
RDW: 17.3 % — ABNORMAL HIGH (ref 11.5–15.5)
RDW: 17.5 % — ABNORMAL HIGH (ref 11.5–15.5)
WBC: 8.2 10*3/uL (ref 4.0–10.5)
WBC: 9.5 10*3/uL (ref 4.0–10.5)
nRBC: 0 % (ref 0.0–0.2)
nRBC: 0 % (ref 0.0–0.2)

## 2019-04-07 LAB — SARS CORONAVIRUS 2 (TAT 6-24 HRS): SARS Coronavirus 2: NEGATIVE

## 2019-04-07 LAB — RPR: RPR Ser Ql: NONREACTIVE

## 2019-04-07 MED ORDER — FENTANYL CITRATE (PF) 100 MCG/2ML IJ SOLN
INTRAMUSCULAR | Status: AC
  Filled 2019-04-07: qty 2

## 2019-04-07 MED ORDER — TERBUTALINE SULFATE 1 MG/ML IJ SOLN: 0.2500 mg | Freq: Once | INTRAMUSCULAR | Status: DC | PRN

## 2019-04-07 MED ORDER — BUPIVACAINE HCL (PF) 0.25 % IJ SOLN
INTRAMUSCULAR | Status: DC | PRN
  Administered 2019-04-07: 8 mL via EPIDURAL
  Administered 2019-04-08: .7 mL via INTRATHECAL
  Administered 2019-04-08: 8 mL via EPIDURAL

## 2019-04-07 MED ORDER — FENTANYL CITRATE (PF) 100 MCG/2ML IJ SOLN
100.0000 ug | INTRAMUSCULAR | Status: DC | PRN
  Administered 2019-04-07 (×2): 100 ug via INTRAVENOUS
  Filled 2019-04-07 (×3): qty 2

## 2019-04-07 MED ORDER — SODIUM CHLORIDE (PF) 0.9 % IJ SOLN
INTRAMUSCULAR | Status: DC | PRN
  Administered 2019-04-07: 12 mL/h via EPIDURAL

## 2019-04-07 MED ORDER — PHENYLEPHRINE 40 MCG/ML (10ML) SYRINGE FOR IV PUSH (FOR BLOOD PRESSURE SUPPORT): 80.0000 ug | PREFILLED_SYRINGE | INTRAVENOUS | Status: DC | PRN

## 2019-04-07 MED ORDER — FENTANYL CITRATE (PF) 100 MCG/2ML IJ SOLN
50.0000 ug | INTRAMUSCULAR | Status: DC | PRN
  Administered 2019-04-07: 50 ug via INTRAVENOUS

## 2019-04-07 MED ORDER — LACTATED RINGERS IV SOLN
500.0000 mL | Freq: Once | INTRAVENOUS | Status: AC
  Administered 2019-04-07: 500 mL via INTRAVENOUS

## 2019-04-07 MED ORDER — EPHEDRINE 5 MG/ML INJ: 10.0000 mg | INTRAVENOUS | Status: DC | PRN

## 2019-04-07 MED ORDER — OXYTOCIN 40 UNITS IN NORMAL SALINE INFUSION - SIMPLE MED
1.0000 m[IU]/min | INTRAVENOUS | Status: DC
  Administered 2019-04-07 – 2019-04-08 (×2): 2 m[IU]/min via INTRAVENOUS
  Filled 2019-04-07: qty 1000

## 2019-04-07 MED ORDER — FENTANYL-BUPIVACAINE-NACL 0.5-0.125-0.9 MG/250ML-% EP SOLN
12.0000 mL/h | EPIDURAL | Status: DC | PRN
  Administered 2019-04-08: 12 mL/h via EPIDURAL
  Filled 2019-04-07 (×2): qty 250

## 2019-04-07 MED ORDER — LIDOCAINE HCL (PF) 1 % IJ SOLN
INTRAMUSCULAR | Status: DC | PRN
  Administered 2019-04-07 (×2): 5 mL via EPIDURAL

## 2019-04-07 MED ORDER — DIPHENHYDRAMINE HCL 50 MG/ML IJ SOLN: 12.5000 mg | INTRAMUSCULAR | Status: DC | PRN

## 2019-04-07 NOTE — Progress Notes (Signed)
   Cassidy Gutierrez is a 28 y.o. G2P0010 at [redacted]w[redacted]d  admitted for [facility]   Subjective:  Doing well, no complaints.  Objective: Vitals:   04/07/19 0759 04/07/19 0826 04/07/19 0834 04/07/19 0921  BP:   (!) 136/92 119/79  Pulse:   77 82  Resp:   17 18  Temp: 99 F (37.2 C)     TempSrc: Oral     Weight:  112.9 kg    Height:  5\' 5"  (1.651 m)     No intake/output data recorded.  FHT:  FHR: 130 bpm, variability: moderate,  accelerations:  Present,  decelerations:  Absent UC:   irregular, every 3 minutes SVE:   Dilation: 4 Effacement (%): 50 Station: -2 Exam by:: Delorise Shiner, RN  Pitocin @ 4 mu/min  Labs: Lab Results  Component Value Date   WBC 7.3 04/06/2019   HGB 9.2 (L) 04/06/2019   HCT 28.3 (L) 04/06/2019   MCV 73.5 (L) 04/06/2019   PLT 198 04/06/2019    Assessment / Plan: early labor; coping well.   Labor: Progressing normally Fetal Wellbeing:  Category I Pain Control:  IV pain meds Anticipated MOD:  NSVD  Starr Lake 04/07/2019, 9:31 AM

## 2019-04-07 NOTE — Progress Notes (Addendum)
Subjective: Pt reporting regular, painful contractions. Rates 9/10.   Objective: BP (!) 142/79   Pulse 73   Temp 98.6 F (37 C) (Oral)   Resp 18   Ht 5\' 5"  (1.651 m)   Wt 112.9 kg   BMI 41.44 kg/m   Fetal Well-Being: -- FHR 125, moderate variability, +accels, no decels  Toco: -- contractions q 2-4 minutes  Dilation: 4 Effacement (%): 50 Cervical Position: Middle Station: -2 Presentation: Vertex Exam by:: [name], Student Midwife  Assessment and Plan: 28 y.o. G2P0010 [redacted]w[redacted]d admitted for induction of labor for [facility]  Labor:  -- Cervical exam unchanged from previous exam. Currently on 14 milliunits of Pitocin. Continue titrating Pitocin per protocol -- GBS negative -- Pain control: IV pain medication; epidural upon maternal request -- PPH Risk: high -- Anticipate SVD  GHTN:  -- BP's stable 120s-140s/60s-80s -- Pt asymptomatic  [name], SNM 3:13 PM   I confirm that I have verified the information documented in the nurse midwife student's note and that I have also personally reperformed the history, physical exam and all medical decision making activities of this service and have verified that all service and findings are accurately documented in this student's note.   -Patient's exam unchanged; now on 14 of pitocin. -will reassess at 5 pm; if unchanged with consider D/C of pitocin and return to cytotec as patient's cervix is still thick.  -Patient's BPs are stable; no symptoms at this.    Starr Lake, CNM 04/07/2019 4:05 PM

## 2019-04-07 NOTE — Progress Notes (Addendum)
Cassidy Gutierrez is a 28 y.o. G2P0010 at [redacted]w[redacted]d admitted for induction of labor due to gHypertension.  Subjective: Comfortable and sleeping now.   Objective: BP 121/69   Pulse 74   Temp 98.6 F (37 C) (Oral)   Resp 16   Ht 5\' 5"  (1.651 m)   Wt 112.9 kg   BMI 41.44 kg/m  No intake/output data recorded.  FHT:  FHR: 135 bpm, variability: moderate,  accelerations:  Present,  decelerations:  Absent UC:   Irregular on Toco  SVE:   Dilation: 4 Effacement (%): 50 Station: -2 Exam by:: Delorise Shiner, RN   Pitocin @ 10 mu/min  Labs: Lab Results  Component Value Date   WBC 8.2 04/07/2019   HGB 9.6 (L) 04/07/2019   HCT 30.2 (L) 04/07/2019   MCV 74.0 (L) 04/07/2019   PLT 216 04/07/2019    Assessment / Plan: Cassidy Gutierrez is a 28 y.o G2P0010 at [redacted]w[redacted]d here for IOF for gHTN  Labor: Progressing on Pitocin.  Continue to titrate.  Consider AROM  Fetal Wellbeing:  Category I Pain Control:  IV pain meds I/D:  GBS negative Anticipated MOD:  Vaginal Delivery, CS as appropriate  Carollee Leitz MD PGY1 Family Practice 04/07/2019, 2:03 PM   I confirm that I have verified the information documented in the resident's note and that I have also personally reperformed the history, physical exam and all medical decision making activities of this service and have verified that all service and findings are accurately documented in this student's note.    Starr Lake, [place] 04/07/2019 4:10 PM

## 2019-04-07 NOTE — Progress Notes (Addendum)
Cassidy Gutierrez is a 28 y.o. G2P0010 at [redacted]w[redacted]d admitted for induction of labor due to gHypertension.  Subjective: Epidural now in place.  Feeling comfortable.  No concerns voiced.  Objective: BP (!) 142/94   Pulse 85   Temp 98.6 F (37 C) (Axillary)   Resp 18   Ht 5\' 5"  (1.651 m)   Wt 112.9 kg   SpO2 100%   BMI 41.44 kg/m  No intake/output data recorded.  FHT:  FHR: 135bpm, variability: moderate,  accelerations:  Present,  decelerations:  Absent UC: Irregular on Toco  SVE:   Dilation: 4 Effacement (%): 50, 60 Station: -2 Exam by:: Delorise Shiner, RN and Dr. Volanda Napoleon  Pitocin @ 10 mu/min  Labs: Lab Results  Component Value Date   WBC 9.5 04/07/2019   HGB 10.3 (L) 04/07/2019   HCT 31.5 (L) 04/07/2019   MCV 73.4 (L) 04/07/2019   PLT 200 04/07/2019    Assessment / Plan: Cassidy Gutierrez is a 28 y.o G2P0010 at [redacted]w[redacted]d here for Induction of labor due to gestational hypertension  Labor: s/p cytotec x 2, FB,  Discontinue  pitocin, Buccal Cytotec x1. Consider IUPC at next check. AROM as appropriate. Fetal Wellbeing:  Category II Pain Control:  Epidural I/D:  GBS negative Anticipated MOD:  Vaginal Delivery, CS as appropriate  Carollee Leitz DO OB Fellow, Faculty Practice 04/07/2019, 6:18 PM   I confirm that I have verified the information documented in the resident's note and that I have also personally reperformed the history, physical exam and all medical decision making activities of this service and have verified that all service and findings are accurately documented in this student's note.   -Patient is unchanged after pitocin all afternoon; will now give cytotec again for ripening and consider AROM if necessary.    Starr Lake, [place] 04/07/2019 7:05 PM       -will dicon

## 2019-04-07 NOTE — Progress Notes (Signed)
LABOR PROGRESS NOTE  Cassidy Gutierrez is a 28 y.o. G2P0010 at [redacted]w[redacted]d  admitted for IOL for gHTN.  Subjective: Patient is feeling some increased pelvic cramping. She would like to do a FB at this time.   Objective: BP 133/77   Pulse 91   Temp 99.6 F (37.6 C) (Oral)   Resp 16  or  Vitals:   04/07/19 0001 04/07/19 0100 04/07/19 0208 04/07/19 0234  BP: 127/74 137/90 126/77 133/77  Pulse: 88 89 85 91  Resp:      Temp:      TempSrc:       0245 Dilation: 1.5 Effacement (%): 50 Station: 0 Presentation: Vertex Exam by:: Waynetta Metheny, DO FHT: baseline rate 135, moderate varibility, 15x15 acel, neg decel Toco: mild  Labs: Lab Results  Component Value Date   WBC 7.3 04/06/2019   HGB 9.2 (L) 04/06/2019   HCT 28.3 (L) 04/06/2019   MCV 73.5 (L) 04/06/2019   PLT 198 04/06/2019    Patient Active Problem List   Diagnosis Date Noted  . Gestational hypertension, third trimester 04/06/2019  . Gestational hypertension 04/06/2019  . Anemia 03/03/2019  . UTI (urinary tract infection) during pregnancy, first trimester 02/06/2019  . Sickle cell trait ([place]) 12/29/2018  . Insufficient prenatal care 12/29/2018  . Maternal obesity affecting pregnancy, antepartum 12/29/2018  . Supervision of low-risk pregnancy 12/19/2018    Assessment / Plan: 28 y.o. G2P0010 at [redacted]w[redacted]d here for  IOL for gHTN.  Labor: administering 2nd cytotec at this time and also applied FB Fetal Wellbeing:  Cat I Pain Control:  Fentanyl  Anticipated MOD:  vaginal  Doristine Mango, DO PGY-2 FM  04/07/2019, 2:53 AM

## 2019-04-07 NOTE — Anesthesia Preprocedure Evaluation (Addendum)
Anesthesia Evaluation  Patient identified by MRN, date of birth, ID band Patient awake    Reviewed: Allergy & Precautions, NPO status , Patient's Chart, lab work & pertinent test results  History of Anesthesia Complications Negative for: history of anesthetic complications  Airway Mallampati: II  TM Distance: >3 FB Neck ROM: Full    Dental no notable dental hx.    Pulmonary former smoker,    Pulmonary exam normal        Cardiovascular hypertension (gestational), Normal cardiovascular exam     Neuro/Psych negative neurological ROS     GI/Hepatic negative GI ROS, Neg liver ROS,   Endo/Other  Morbid obesity  Renal/GU negative Renal ROS     Musculoskeletal negative musculoskeletal ROS (+)   Abdominal   Peds  Hematology negative hematology ROS (+)   Anesthesia Other Findings Day of surgery medications reviewed with the patient.  Reproductive/Obstetrics (+) Pregnancy                            Anesthesia Physical Anesthesia Plan  ASA: III  Anesthesia Plan: Epidural   Post-op Pain Management:    Induction:   PONV Risk Score and Plan: Treatment may vary due to age or medical condition  Airway Management Planned: Natural Airway  Additional Equipment: None  Intra-op Plan:   Post-operative Plan:   Informed Consent: I have reviewed the patients History and Physical, chart, labs and discussed the procedure including the risks, benefits and alternatives for the proposed anesthesia with the patient or authorized representative who has indicated his/her understanding and acceptance.       Plan Discussed with:   Anesthesia Plan Comments:        Anesthesia Quick Evaluation

## 2019-04-07 NOTE — Anesthesia Procedure Notes (Signed)
Epidural Patient location during procedure: OB Start time: 04/07/2019 4:54 PM End time: 04/07/2019 5:04 PM  Staffing Anesthesiologist: Duane Boston, MD Performed: anesthesiologist   Preanesthetic Checklist Completed: patient identified, site marked, pre-op evaluation, timeout performed, IV checked, risks and benefits discussed and monitors and equipment checked  Epidural Patient position: sitting Prep: DuraPrep Patient monitoring: heart rate, cardiac monitor, continuous pulse ox and blood pressure Approach: midline Location: L2-L3 Injection technique: LOR saline  Needle:  Needle type: Tuohy  Needle gauge: 17 G Needle length: 9 cm Needle insertion depth: 9 cm Catheter size: 20 Guage Catheter at skin depth: 14 cm Test dose: negative and Other  Assessment Events: blood not aspirated, injection not painful, no injection resistance and negative IV test  Additional Notes Informed consent obtained prior to proceeding including risk of failure, 1% risk of PDPH, risk of minor discomfort and bruising.  Discussed rare but serious complications including epidural abscess, permanent nerve injury, epidural hematoma.  Discussed alternatives to epidural analgesia and patient desires to proceed.  Timeout performed pre-procedure verifying patient name, procedure, and platelet count.  Patient tolerated procedure well. After catheter inserted, I was unable to inject, so catheter was replace during the same procedure.  Second catheter injected normally.

## 2019-04-07 NOTE — Progress Notes (Signed)
   Cassidy Gutierrez is a 28 y.o. G2P0010 at [redacted]w[redacted]d  admitted for induction of labor due to Hypertension.  Subjective: Sleeping in bed, no complaints.   Objective: Vitals:   04/07/19 1048 04/07/19 1123 04/07/19 1302 04/07/19 1303  BP: 131/75 133/76  128/67  Pulse: 82 77  77  Resp: 18 16  20   Temp:   98.6 F (37 C)   TempSrc:   Oral   Weight:      Height:       No intake/output data recorded.  FHT:  FHR: 135 bpm, variability: moderate,  accelerations:  Present,  decelerations:  Absent UC:   irregular, every 1-6 minutes SVE:   Dilation: 4 Effacement (%): 50 Station: -2 Exam by:: Cassidy Shiner, RN  Pitocin @ 10 mu/min  Labs: Lab Results  Component Value Date   WBC 8.2 04/07/2019   HGB 9.6 (L) 04/07/2019   HCT 30.2 (L) 04/07/2019   MCV 74.0 (L) 04/07/2019   PLT 216 04/07/2019    Assessment / Plan: pitocin now at 10, will plan to recheck cervix after nap.  Continue to titrate pitocin 2 x 2 Labor: early labor Fetal Wellbeing:  Category I Pain Control:  Epidural Anticipated MOD:  NSVD  Cassidy Gutierrez Cassidy Gutierrez 04/07/2019, 1:29 PM

## 2019-04-07 NOTE — Progress Notes (Addendum)
Subjective: In to introduce self to patient. Pt doing well. Pt reports some mild cramping occasionally. No other complaints. FOB at bedside.   Objective: BP 119/79   Pulse 82   Temp 99 F (37.2 C) (Oral)   Resp 18   Ht 5\' 5"  (1.651 m)   Wt 112.9 kg   BMI 41.44 kg/m   Fetal Well-Being: -- FHR 130, moderate variability, +accels, no decels  Toco: -- contractions q 2-4  Dilation: 4 Effacement (%): 50 Cervical Position: Middle Station: -2 Presentation: Vertex Exam by:: Delorise Shiner, RN   Assessment and Plan: 28 y.o. G2P0010 [redacted]w[redacted]d admitted for induction of labor for gHTN  Labor:  -- S/p foley bulb and cytotec, now on Pitocin -- GBS negative -- Pain control: planning IV pain meds -- PPH Risk: high -- Anticipate SVD  GHTN: -- Bps stable at 110s-130s/80s-90s -- Pre-E labs normal. Pt asymptomatic.  [name], SNM 9:27 AM  I confirm that I have verified the information documented in the nurse midwife student's note and that I have also personally reperformed the history, physical exam and all medical decision making activities of this service and have verified that all service and findings are accurately documented in this student's note.     Starr Lake, CNM 04/07/2019 11:23 AM

## 2019-04-08 ENCOUNTER — Encounter (HOSPITAL_COMMUNITY): Payer: Self-pay | Admitting: General Practice

## 2019-04-08 DIAGNOSIS — O134 Gestational [pregnancy-induced] hypertension without significant proteinuria, complicating childbirth: Secondary | ICD-10-CM

## 2019-04-08 DIAGNOSIS — Z3A37 37 weeks gestation of pregnancy: Secondary | ICD-10-CM

## 2019-04-08 LAB — COMPREHENSIVE METABOLIC PANEL
ALT: 16 U/L (ref 0–44)
AST: 21 U/L (ref 15–41)
Albumin: 2.3 g/dL — ABNORMAL LOW (ref 3.5–5.0)
Alkaline Phosphatase: 218 U/L — ABNORMAL HIGH (ref 38–126)
Anion gap: 6 (ref 5–15)
BUN: 9 mg/dL (ref 6–20)
CO2: 22 mmol/L (ref 22–32)
Calcium: 8.5 mg/dL — ABNORMAL LOW (ref 8.9–10.3)
Chloride: 106 mmol/L (ref 98–111)
Creatinine, Ser: 1.17 mg/dL — ABNORMAL HIGH (ref 0.44–1.00)
GFR calc Af Amer: >60 mL/min (ref >60)
GFR calc non Af Amer: >60 mL/min (ref >60)
Glucose, Bld: 95 mg/dL (ref 70–99)
Potassium: 5.4 mmol/L — ABNORMAL HIGH (ref 3.5–5.1)
Sodium: 134 mmol/L — ABNORMAL LOW (ref 135–145)
Total Bilirubin: 0.5 mg/dL (ref 0.3–1.2)
Total Protein: 6 g/dL — ABNORMAL LOW (ref 6.5–8.1)

## 2019-04-08 LAB — CBC
HCT: 30.7 % — ABNORMAL LOW (ref 36.0–46.0)
HCT: 27.1 % — ABNORMAL LOW (ref 36.0–46.0)
Hemoglobin: 9.9 g/dL — ABNORMAL LOW (ref 12.0–15.0)
Hemoglobin: 8.8 g/dL — ABNORMAL LOW (ref 12.0–15.0)
MCH: 24 pg — ABNORMAL LOW (ref 26.0–34.0)
MCH: 23.8 pg — ABNORMAL LOW (ref 26.0–34.0)
MCHC: 32.2 g/dL (ref 30.0–36.0)
MCHC: 32.5 g/dL (ref 30.0–36.0)
MCV: 74.3 fL — ABNORMAL LOW (ref 80.0–100.0)
MCV: 73.4 fL — ABNORMAL LOW (ref 80.0–100.0)
Platelets: 216 10*3/uL (ref 150–400)
Platelets: 218 10*3/uL (ref 150–400)
RBC: 4.13 MIL/uL (ref 3.87–5.11)
RBC: 3.69 MIL/uL — ABNORMAL LOW (ref 3.87–5.11)
RDW: 17.6 % — ABNORMAL HIGH (ref 11.5–15.5)
RDW: 17.5 % — ABNORMAL HIGH (ref 11.5–15.5)
WBC: 12.8 10*3/uL — ABNORMAL HIGH (ref 4.0–10.5)
WBC: 16.4 10*3/uL — ABNORMAL HIGH (ref 4.0–10.5)
nRBC: 0 % (ref 0.0–0.2)
nRBC: 0 % (ref 0.0–0.2)

## 2019-04-08 MED ORDER — BENZOCAINE-MENTHOL 20-0.5 % EX AERO
1.0000 "application " | INHALATION_SPRAY | CUTANEOUS | Status: DC | PRN
  Administered 2019-04-08: 1 via TOPICAL
  Filled 2019-04-08: qty 56

## 2019-04-08 MED ORDER — ONDANSETRON HCL 4 MG PO TABS: 4.0000 mg | ORAL_TABLET | ORAL | Status: DC | PRN

## 2019-04-08 MED ORDER — ZOLPIDEM TARTRATE 5 MG PO TABS: 5.0000 mg | ORAL_TABLET | Freq: Every evening | ORAL | Status: DC | PRN

## 2019-04-08 MED ORDER — ONDANSETRON HCL 4 MG/2ML IJ SOLN: 4.0000 mg | INTRAMUSCULAR | Status: DC | PRN

## 2019-04-08 MED ORDER — SIMETHICONE 80 MG PO CHEW: 80.0000 mg | CHEWABLE_TABLET | ORAL | Status: DC | PRN

## 2019-04-08 MED ORDER — FERROUS SULFATE 325 (65 FE) MG PO TABS
325.0000 mg | ORAL_TABLET | Freq: Two times a day (BID) | ORAL | Status: DC
  Administered 2019-04-09: 325 mg via ORAL
  Filled 2019-04-08 (×3): qty 1

## 2019-04-08 MED ORDER — COCONUT OIL OIL
1.0000 "application " | TOPICAL_OIL | Status: DC | PRN
  Administered 2019-04-10: 1 via TOPICAL

## 2019-04-08 MED ORDER — TETANUS-DIPHTH-ACELL PERTUSSIS 5-2.5-18.5 LF-MCG/0.5 IM SUSP: 0.5000 mL | Freq: Once | INTRAMUSCULAR | Status: DC

## 2019-04-08 MED ORDER — WITCH HAZEL-GLYCERIN EX PADS: 1.0000 "application " | MEDICATED_PAD | CUTANEOUS | Status: DC | PRN

## 2019-04-08 MED ORDER — FENTANYL CITRATE (PF) 100 MCG/2ML IJ SOLN
INTRAMUSCULAR | Status: DC | PRN
  Administered 2019-04-08: 15 ug via INTRATHECAL

## 2019-04-08 MED ORDER — PRENATAL MULTIVITAMIN CH
1.0000 | ORAL_TABLET | Freq: Every day | ORAL | Status: DC
  Administered 2019-04-09: 1 via ORAL
  Filled 2019-04-08 (×2): qty 1

## 2019-04-08 MED ORDER — LIDOCAINE-EPINEPHRINE (PF) 2 %-1:200000 IJ SOLN
INTRAMUSCULAR | Status: DC | PRN
  Administered 2019-04-08: 5 mL via EPIDURAL
  Administered 2019-04-08: 4 mL via EPIDURAL

## 2019-04-08 MED ORDER — DIBUCAINE (PERIANAL) 1 % EX OINT
1.0000 "application " | TOPICAL_OINTMENT | CUTANEOUS | Status: DC | PRN
  Administered 2019-04-08: 1 via RECTAL
  Filled 2019-04-08 (×2): qty 28

## 2019-04-08 MED ORDER — LACTATED RINGERS AMNIOINFUSION
INTRAVENOUS | Status: DC
  Administered 2019-04-08: 13:00:00 via INTRAUTERINE

## 2019-04-08 MED ORDER — SENNOSIDES-DOCUSATE SODIUM 8.6-50 MG PO TABS
2.0000 | ORAL_TABLET | ORAL | Status: DC
  Administered 2019-04-09 (×2): 2 via ORAL
  Filled 2019-04-08 (×2): qty 2

## 2019-04-08 MED ORDER — DIPHENHYDRAMINE HCL 25 MG PO CAPS: 25.0000 mg | ORAL_CAPSULE | Freq: Four times a day (QID) | ORAL | Status: DC | PRN

## 2019-04-08 MED ORDER — ACETAMINOPHEN 325 MG PO TABS
650.0000 mg | ORAL_TABLET | ORAL | Status: DC | PRN
  Administered 2019-04-08: 650 mg via ORAL
  Filled 2019-04-08: qty 2

## 2019-04-08 MED ORDER — IBUPROFEN 600 MG PO TABS
600.0000 mg | ORAL_TABLET | Freq: Four times a day (QID) | ORAL | Status: DC
  Administered 2019-04-08 – 2019-04-10 (×6): 600 mg via ORAL
  Filled 2019-04-08 (×7): qty 1

## 2019-04-08 NOTE — Progress Notes (Addendum)
LABOR PROGRESS NOTE  Cassidy Gutierrez is a 28 y.o. G2P0010 at [redacted]w[redacted]d  admitted for IOL for gHTN.  Subjective: Blood pressures have been well controlled mostly 120/70s with intermittent elevated up to 142/88. Last cytotec given 2259. SROM at ~0017. She is having increased contraction pain and pelvic pressure.   Objective: BP 138/80   Pulse 79   Temp 98.4 F (36.9 C) (Oral)   Resp 18   Ht 5\' 5"  (1.651 m)   Wt 112.9 kg   SpO2 99%   BMI 41.44 kg/m  or  Vitals:   04/08/19 0101 04/08/19 0131 04/08/19 0201 04/08/19 0301  BP: 140/77 124/65 126/78 138/80  Pulse: 99 82 88 79  Resp:      Temp:      TempSrc:      SpO2:      Weight:      Height:       0240 Dilation: 5.5 Effacement (%): 70 Cervical Position: Middle Station: 0 Presentation: Vertex Exam by:: Michelle Wnek DO  FHT: baseline rate 135, moderate varibility, 15x15 acel, neg decel Toco: moderate  Labs: Lab Results  Component Value Date   WBC 9.5 04/07/2019   HGB 10.3 (L) 04/07/2019   HCT 31.5 (L) 04/07/2019   MCV 73.4 (L) 04/07/2019   PLT 200 04/07/2019    Patient Active Problem List   Diagnosis Date Noted  . Gestational hypertension, third trimester 04/06/2019  . Gestational hypertension 04/06/2019  . Anemia 03/03/2019  . UTI (urinary tract infection) during pregnancy, first trimester 02/06/2019  . Sickle cell trait ([place]) 12/29/2018  . Insufficient prenatal care 12/29/2018  . Maternal obesity affecting pregnancy, antepartum 12/29/2018  . Supervision of low-risk pregnancy 12/19/2018    Assessment / Plan: 28 y.o. G2P0010 at [redacted]w[redacted]d here for IOL 2/2 gHTN.  Labor: progressing well. Restart pitocin after 0259 at 2 and increase PRN.  Fetal Wellbeing:  Cat I Pain Control:  epidural Anticipated MOD:  vaginal  Doristine Mango, DO PGY-2 FM 04/08/2019, 3:16 AM

## 2019-04-08 NOTE — Anesthesia Procedure Notes (Signed)
Epidural Patient location during procedure: OB Start time: 04/08/2019 5:32 AM End time: 04/08/2019 5:36 AM  Staffing Anesthesiologist: Brennan Bailey, MD Performed: anesthesiologist   Preanesthetic Checklist Completed: patient identified, pre-op evaluation, timeout performed, IV checked, risks and benefits discussed and monitors and equipment checked  Epidural Patient position: sitting Prep: site prepped and draped and DuraPrep Patient monitoring: heart rate, continuous pulse ox and blood pressure Approach: midline Location: L3-L4 Injection technique: LOR air  Needle:  Needle type: Tuohy  Needle gauge: 17 G Needle length: 9 cm Needle insertion depth: 9 cm Catheter type: closed end flexible Catheter size: 19 Gauge Catheter at skin depth: 14 cm  Assessment Events: blood not aspirated, injection not painful, no injection resistance, negative IV test and no paresthesia  Additional Notes Patient identified. Risks, benefits, and alternatives discussed with patient including but not limited to bleeding, infection, nerve damage, paralysis, failed block, incomplete pain control, headache, blood pressure changes, nausea, vomiting, reactions to medication, itching, and postpartum back pain. Confirmed with bedside nurse the patient's most recent platelet count. Confirmed with patient that they are not currently taking any anticoagulation, have any bleeding history, or any family history of bleeding disorders. Patient expressed understanding and wished to proceed. All questions were answered. Sterile technique was used throughout the entire procedure. Please see nursing notes for vital signs.   *Previous epidural catheter removed with tip intact. Crisp LOR with Tuohy needle after one needle redirection. [place] 25g 140mm spinal needle introduced through Tuohy with clear CSF return prior to injection of intrathecal medication. Spinal needle withdrawn and epidural catheter threaded easily.  Negative aspiration of catheter for heme or CSF prior to starting epidural infusion. Warning signs of high block given to the patient including shortness of breath, tingling/numbness in hands, complete motor block, or any concerning symptoms with instructions to call for help. Patient was given instructions on fall risk and not to get out of bed. All questions and concerns addressed with instructions to call with any issues or inadequate analgesia.  Patient comfortable with contractions prior to my leaving room. Reason for block:procedure for pain

## 2019-04-08 NOTE — Discharge Summary (Signed)
Postpartum Discharge Summary     Patient Name: Cassidy Gutierrez DOB: Jun 13, 1990 MRN: [id number]  Date of admission: 04/06/2019 Delivering Provider: Laury Deep   Date of discharge: 04/10/2019  Admitting diagnosis: HBP Intrauterine pregnancy: [redacted]w[redacted]d    Secondary diagnosis:  Active Problems:   Gestational hypertension, third trimester   Gestational hypertension   SVD (spontaneous vaginal delivery)  Additional problems: none     Discharge diagnosis: Term Pregnancy Delivered                                                                                                Post partum procedures: None  Augmentation: Pitocin, Cytotec and Foley Balloon  Complications: None  Hospital course:  Induction of Labor With Vaginal Delivery   28y.o. yo G2P0010 at 329w6das admitted to the hospital 04/06/2019 for induction of labor.  Indication for induction: Gestational hypertension.  Patient had an uncomplicated labor course as follows: Membrane Rupture Time/Date: 12:17 AM ,04/08/2019   Intrapartum Procedures: Episiotomy: None [1]                                         Lacerations:  2nd degree [3];Sulcus [9];Vaginal [6];Perineal [11]  Patient had delivery of a Viable infant.  Information for the patient's newborn:  Cassidy, Gutierrez[[id number]]Delivery Method: Vaginal, Spontaneous(Filed from Delivery Summary)    04/08/2019  Details of delivery can be found in separate delivery note.  Patient had a routine postpartum course. Patient is discharged home 04/10/19. Delivery time: 6:08 PM    Magnesium Sulfate received: No BMZ received: No Rhophylac:N/A MMR:N/A Transfusion:No  Physical exam  Vitals:   04/09/19 1459 04/09/19 2100 04/09/19 2240 04/10/19 0548  BP: 138/87  123/83 126/81  Pulse: 99  90 85  Resp: '17  18 20  ' Temp: 98.1 F (36.7 C) 98.7 F (37.1 C) 99.3 F (37.4 C) 98.6 F (37 C)  TempSrc: Oral Oral Oral Oral  SpO2: 100%   100%  Weight:      Height:        General: alert, cooperative and no distress Lochia: appropriate Uterine Fundus: firm Incision: N/A DVT Evaluation: No evidence of DVT seen on physical exam. Negative Homan's sign. No cords or calf tenderness. No significant calf/ankle edema. Labs: Lab Results  Component Value Date   WBC 16.4 (H) 04/08/2019   HGB 8.8 (L) 04/08/2019   HCT 27.1 (L) 04/08/2019   MCV 73.4 (L) 04/08/2019   PLT 218 04/08/2019   CMP Latest Ref Rng & Units 04/08/2019  Glucose 70 - 99 mg/dL 95  BUN 6 - 20 mg/dL 9  Creatinine 0.44 - 1.00 mg/dL 1.17(H)  Sodium 135 - 145 mmol/L 134(L)  Potassium 3.5 - 5.1 mmol/L 5.4(H)  Chloride 98 - 111 mmol/L 106  CO2 22 - 32 mmol/L 22  Calcium 8.9 - 10.3 mg/dL 8.5(L)  Total Protein 6.5 - 8.1 g/dL 6.0(L)  Total Bilirubin 0.3 - 1.2 mg/dL 0.5  Alkaline Phos 38 - 126 U/L 218(H)  AST 15 - 41 U/L 21  ALT 0 - 44 U/L 16    Discharge instruction: per After Visit Summary and "Baby and Me Booklet".  After visit meds:  Allergies as of 04/10/2019   No Known Allergies     Medication List    STOP taking these medications   aspirin 81 MG EC tablet Commonly known as: Aspirin 81     TAKE these medications   acetaminophen 500 MG tablet Commonly known as: TYLENOL Take 500 mg by mouth every 6 (six) hours as needed.   Blood Pressure Kit Devi 1 Device by Does not apply route as needed. ICD 10 Z   calcium carbonate 500 MG chewable tablet Commonly known as: TUMS - dosed in mg elemental calcium Chew 1-2 tablets by mouth as needed for indigestion or heartburn.   ferrous fumarate 325 (106 Fe) MG Tabs tablet Commonly known as: HEMOCYTE - 106 mg FE Take 1 tablet (106 mg of iron total) by mouth daily.   ferrous sulfate 325 (65 FE) MG tablet Take 1 tablet (325 mg total) by mouth 2 (two) times daily with a meal.   ibuprofen 600 MG tablet Commonly known as: ADVIL Take 1 tablet (600 mg total) by mouth every 6 (six) hours.   multivitamin-prenatal 27-0.8 MG Tabs  tablet Take 1 tablet by mouth daily at 12 noon.       Diet: routine diet  Activity: Advance as tolerated. Pelvic rest for 6 weeks.   Outpatient follow up:4 weeks Follow up Appt: Future Appointments  Date Time Provider [place]  04/12/2019  4:15 PM Tresea Mall, CNM [facility] [place]   Follow up Visit: [place] for [facility]. Schedule an appointment as soon as possible for a visit in 1 week(s).   Specialty: Obstetrics and Gynecology Why: For blood pressure check and then in 4 weeks for postpartum visit Contact information: [address] [place], [place] [id number] [place] [phone]           Please schedule this patient for Postpartum visit in: 4 weeks with the following provider: Any provider For C/S patients schedule nurse incision check in weeks 2 weeks: no High risk pregnancy complicated by: HTN Delivery mode:  SVD Anticipated Birth Control:  Condoms PP Procedures needed: BP check  Schedule Integrated [place] visit: no   Newborn Data: Live born female " Cassidy Gutierrez " Birth Weight:   APGAR: 35, 9  Newborn Delivery   Birth date/time: 04/08/2019 18:08:00 Delivery type: Vaginal, Spontaneous      Baby Feeding: Breast Disposition:home with mother   04/10/2019 Cassidy Baba, DO

## 2019-04-08 NOTE — Progress Notes (Signed)
Patient ID: Cassidy Gutierrez, female   DOB: 1990-09-10, 28 y.o.   MRN: [id number]   Consult with Dr. Kennon Rounds @ 1255 - notified of patient's complaints, assessments, recommended tx plan redraw PEC labs and continue to monitor.  Questions of possible repetitive late decelerations by nursing. FHR tracing reviewed with Fatima Blank, CNM. No concern for fetal acidemia in the setting of the current FHR tracing. Tracing otherwise Category 1. Moderate variability, (+) accels. Occasional variable decelerations noted. Verbal orders give to start an amnioinfusion with 300 ml bolus and 150 ml/hr. Will continue to monitor.  Laury Deep, CNM  04/08/2019 1:06 PM

## 2019-04-08 NOTE — Progress Notes (Signed)
Cassidy Gutierrez is a 28 y.o. G2P0010 at [redacted]w[redacted]d by ultrasound admitted for induction of labor due to gestational hypertension.  Subjective: Requested to come to room to evaluate patient and place a IUPC d/t difficulty tracing UC's.  Objective: BP 108/60   Pulse 88   Temp 98.9 F (37.2 C) (Oral)   Resp 18   Ht 5\' 5"  (1.651 m)   Wt 112.9 kg   SpO2 99%   BMI 41.44 kg/m  I/O last 3 completed shifts: In: -  Out: 3000 [TIWPY:0998] Total I/O In: -  Out: 75 [Urine:75]  FHT:  FHR: 140 bpm, variability: moderate,  accelerations:  Present,  decelerations:  Present variables UC:   regular, every 3-4 minutes SVE:   Dilation: 5 Effacement (%): 90 Station: -1 Exam by:: R Kendel Bessey IUPC placed without difficulty, pt tolerated procedure well  Labs: Lab Results  Component Value Date   WBC 9.5 04/07/2019   HGB 10.3 (L) 04/07/2019   HCT 31.5 (L) 04/07/2019   MCV 73.4 (L) 04/07/2019   PLT 200 04/07/2019    Assessment / Plan: Induction of labor due to gestational hypertension,  progressing well on pitocin  Labor: Progressing normally Preeclampsia:  none Fetal Wellbeing:  Category I Pain Control:  Epidural I/D:  n/a Anticipated MOD:  NSVD  Laury Deep, MSN, CNM 04/08/2019, 11:45 AM

## 2019-04-08 NOTE — Progress Notes (Signed)
LABOR PROGRESS NOTE  Odile Veloso is a 28 y.o. G2P0010 at [redacted]w[redacted]d  admitted for IOL for gHTN.  Subjective: Blood pressures have been well controlled mostly 120/70s with intermittent elevated up to 148/88. On pitocin. Resting comfortable with epidural in place.  Objective: BP 132/88   Pulse 83   Temp 98.8 F (37.1 C) (Oral)   Resp 18   Ht 5\' 5"  (1.651 m)   Wt 112.9 kg   SpO2 100%   BMI 41.44 kg/m  or  Vitals:   04/08/19 0555 04/08/19 0601 04/08/19 0605 04/08/19 0630  BP: 125/82 126/80 125/77 132/88  Pulse: 76 68 81 83  Resp:      Temp:      TempSrc:      SpO2: 100%  100% 100%  Weight:      Height:       0515 Dilation: 4.5 Effacement (%): 90 Cervical Position: Middle Station: 0 Presentation: Vertex Exam by:: Hector Shade, RN FHT: baseline rate 135, moderate varibility, 15x15 acel, neg decel Toco: strong  Labs: Lab Results  Component Value Date   WBC 9.5 04/07/2019   HGB 10.3 (L) 04/07/2019   HCT 31.5 (L) 04/07/2019   MCV 73.4 (L) 04/07/2019   PLT 200 04/07/2019    Patient Active Problem List   Diagnosis Date Noted  . Gestational hypertension, third trimester 04/06/2019  . Gestational hypertension 04/06/2019  . Anemia 03/03/2019  . UTI (urinary tract infection) during pregnancy, first trimester 02/06/2019  . Sickle cell trait ([place]) 12/29/2018  . Insufficient prenatal care 12/29/2018  . Maternal obesity affecting pregnancy, antepartum 12/29/2018  . Supervision of low-risk pregnancy 12/19/2018    Assessment / Plan: 28 y.o. G2P0010 at [redacted]w[redacted]d here for IOL 2/2 gHTN. Nurse re-check had cervix at lower dilation -> 4.5 with increased effacement and same station. Blood pressures mostly within normal range and occasional up to 140s.   Labor: continue pitocin.  Minimizing checks with ruptured membranes to prevent infection. Consider IUPC.  Fetal Wellbeing:  Cat I Pain Control:  epidural Anticipated MOD:  vaginal  Doristine Mango, DO PGY-2 FM 04/08/2019, 6:38  AM

## 2019-04-08 NOTE — Anesthesia Procedure Notes (Signed)
Epidural Patient location during procedure: OB Start time: 04/08/2019 9:21 AM End time: 04/08/2019 9:31 AM  Staffing Anesthesiologist: Josephine Igo, MD Performed: anesthesiologist   Preanesthetic Checklist Completed: patient identified, site marked, surgical consent, pre-op evaluation, timeout performed, IV checked, risks and benefits discussed and monitors and equipment checked  Epidural Patient position: sitting Prep: site prepped and draped and DuraPrep Patient monitoring: continuous pulse ox and blood pressure Approach: midline Location: L3-L4 Injection technique: LOR air  Needle:  Needle type: Tuohy  Needle gauge: 17 G Needle length: 9 cm and 9 Needle insertion depth: 8 cm Catheter type: closed end flexible Catheter size: 19 Gauge Catheter at skin depth: 14 cm Test dose: negative and Other  Assessment Events: blood not aspirated, injection not painful, no injection resistance, negative IV test and no paresthesia  Additional Notes Patient identified. Risks and benefits discussed including failed block, incomplete  Pain control, post dural puncture headache, nerve damage, paralysis, blood pressure Changes, nausea, vomiting, reactions to medications-both toxic and allergic and post Partum back pain. All questions were answered. Patient expressed understanding and wished to proceed. Sterile technique was used throughout procedure. Epidural site was Dressed with sterile barrier dressing. No paresthesias, signs of intravascular injection Or signs of intrathecal spread were encountered.  Patient was more comfortable after the epidural was dosed. Please see RN's note for documentation of vital signs and FHR which are stable. Reason for block:procedure for pain

## 2019-04-09 MED ORDER — GUAIFENESIN 100 MG/5ML PO SOLN
5.0000 mL | ORAL | Status: DC | PRN
  Administered 2019-04-09: 100 mg via ORAL
  Filled 2019-04-09: qty 15

## 2019-04-09 MED ORDER — MENTHOL 3 MG MT LOZG
1.0000 | LOZENGE | OROMUCOSAL | Status: DC | PRN
  Administered 2019-04-09: 3 mg via ORAL
  Filled 2019-04-09: qty 9

## 2019-04-09 NOTE — Lactation Note (Signed)
This note was copied from a baby's chart. Lactation Consultation Note  Patient Name: Cassidy Gutierrez [id number] Date: 04/09/2019 Reason for consult: Follow-up assessment;Primapara;1st time breastfeeding;Early term 37-38.6wks;Infant weight loss;Other (Comment)(3% weight loss /)  Baby is 24 hours old  LC reviewed and updated doc flow sheets with mom.  NP order to see [place].  MBU RN reported to [facility] baby has been spitty and gaggy.  Dad holding baby and LC offered to check the baby's diaper and  It was large wet and stool. Baby gaggy no spit.  During the RN assessment baby stooled again.  [place] 1st reviewed hand expressing and had mom return demo. Mom did well  And needed alittle assistance. 73ml EBM off with hand expressing and LC spoon fed the EBM back to baby. Baby tolerated well.  LC assisted mom to latch the baby in the football / right breast / with depth after  A few attempts. Baby was still feeding at 14 mins with multiple swallows/ increased with breast compressions. Mom has some areola edema and  LC reviewed breast feeding goals to feed 8-12 times a day in 24 hours and with feeding cues. LC reassured mom the spitting will subside after the baby increase stools and the colostrum will enhance stooling.  LC instructed mom  on the use shells between feeding except when sleeping,  And hand pump. ( #24 F borderline snug ) and per mom the #27 F felt better.  Mom has such a good flow of colostrum a DEBP is not needed at this time, the spitting subsided and baby was able to latch and feed well with swallows.   Mom reports she had several breast changes with pregnancy. Breast are noted to be wide space.  Per mom has a [place] at home.    Maternal Data Has patient been taught Hand Expression?: Yes  Feeding Feeding Type: Breast Milk  LATCH Score Latch: Repeated attempts needed to sustain latch, nipple held in mouth throughout feeding, stimulation needed to elicit sucking reflex.  Audible  Swallowing: Spontaneous and intermittent  Type of Nipple: Everted at rest and after stimulation  Comfort (Breast/Nipple): Soft / non-tender  Hold (Positioning): Assistance needed to correctly position infant at breast and maintain latch.  LATCH Score: 8  Interventions Interventions: Breast feeding basics reviewed;Assisted with latch;Skin to skin;Breast massage;Hand express;Reverse pressure;Breast compression;Adjust position;Support pillows;Position options;Shells;Hand pump  Lactation Tools Discussed/Used Tools: Pump;Shells Shell Type: Inverted Breast pump type: Manual Pump Review: Setup, frequency, and cleaning Initiated by:: MAI Date initiated:: 04/09/19   Consult Status Consult Status: Follow-up Date: 04/10/19 Follow-up type: In-patient    [place] 04/09/2019, 6:40 PM

## 2019-04-09 NOTE — Progress Notes (Signed)
POSTPARTUM PROGRESS NOTE  Subjective: Cassidy Gutierrez is a 28 y.o. [id number] s/p for IOL for gHTN at [redacted]w[redacted]d.  She reports she doing well. No acute events overnight. She denies any problems with ambulating, voiding or po intake. Denies nausea or vomiting. She has  passed flatus. Pain is moderately controlled.  Lochia is appropriate.  Objective: Blood pressure 137/80, pulse (!) 104, temperature 98 F (36.7 C), temperature source Oral, resp. rate 18, height 5\' 5"  (1.651 m), weight 112.9 kg, SpO2 100 %, unknown if currently breastfeeding.  Physical Exam:  General: alert, cooperative and no distress Chest: no respiratory distress Abdomen: soft, non-tender  Uterine Fundus: firm, appropriately tender Extremities: No calf swelling or tenderness  edema  Recent Labs    04/08/19 1319 04/08/19 1937  HGB 9.9* 8.8*  HCT 30.7* 27.1*    Assessment/Plan: Cassidy Gutierrez is a 28 y.o. G2P1011 s/p IOL at [redacted]w[redacted]d for gHTN.  Routine Postpartum Care: Doing well, pain well-controlled.  -- Continue routine care, lactation support  -- Contraception: condoms -- Feeding: breast  Dispo: Plan for discharge in am. [place] for [place]

## 2019-04-09 NOTE — Lactation Note (Signed)
This note was copied from a baby's chart. Lactation Consultation Note Baby 98 hrs old. Alert. Has been spitting up. Had large emesis in bed of mucous. LC changed pillow case. Newborn feeding habits, behavior, STS, I&O, breast massage, milk storage, pumping, supply and demand discussed. Mom encouraged to feed baby 8-12 times/24 hours and with feeding cues.  Hand expression taught w/glistening sot of colostrum. Mom has DEBP at home. Encouraged to use hand pump for pre-pumping if needed. Mom has "V" shaped breast w/ short shaft nipples. om stated she didn't have any questions at this time.  Encouraged to call for assistance or questions.  Patient Name: Cassidy Gutierrez [id number] Date: 04/09/2019 Reason for consult: Initial assessment;Primapara;Early term 37-38.6wks   Maternal Data Has patient been taught Hand Expression?: Yes Does the patient have breastfeeding experience prior to this delivery?: No  Feeding    LATCH Score Latch: Too sleepy or reluctant, no latch achieved, no sucking elicited.  Audible Swallowing: None  Type of Nipple: Everted at rest and after stimulation  Comfort (Breast/Nipple): Soft / non-tender        Interventions Interventions: Breast feeding basics reviewed;Position options;Breast massage;Hand express;Breast compression;Hand pump  Lactation Tools Discussed/Used Tools: Pump Breast pump type: Double-Electric Breast Pump;Manual WIC Program: Yes   Consult Status Consult Status: Follow-up Date: 04/09/19 Follow-up type: In-patient    Theodoro Kalata 04/09/2019, 2:55 AM

## 2019-04-09 NOTE — Anesthesia Postprocedure Evaluation (Signed)
Anesthesia Post Note  Patient: Cassidy Gutierrez  Procedure(s) Performed: AN AD HOC LABOR EPIDURAL     Patient location during evaluation: Mother Baby Anesthesia Type: Epidural Level of consciousness: awake and alert and oriented Pain management: satisfactory to patient Vital Signs Assessment: post-procedure vital signs reviewed and stable Respiratory status: spontaneous breathing and nonlabored ventilation Cardiovascular status: stable Postop Assessment: no headache, no backache, no signs of nausea or vomiting, adequate PO intake, patient able to bend at knees and able to ambulate (patient up walking) Anesthetic complications: no    Last Vitals:  Vitals:   04/09/19 0200 04/09/19 0606  BP: 136/89 128/74  Pulse: 93 99  Resp: 18 20  Temp: 36.9 C 37.4 C  SpO2: 100% 100%    Last Pain:  Vitals:   04/09/19 0606  TempSrc: Oral  PainSc: Asleep   Pain Goal: Patients Stated Pain Goal: 7 (04/07/19 0740)                 [name]

## 2019-04-09 NOTE — Progress Notes (Signed)
Pt c/o scratchy throat and cough that started around 1900. Temp normal. Pt requests cough syrup and someone to look at her throat. Dr. Darene Lamer called and up to unit to see pt and enter orders.

## 2019-04-10 MED ORDER — IBUPROFEN 600 MG PO TABS: 600.0000 mg | ORAL_TABLET | Freq: Four times a day (QID) | ORAL | 0 refills | Status: DC

## 2019-04-10 MED ORDER — FERROUS SULFATE 325 (65 FE) MG PO TABS: 325.0000 mg | ORAL_TABLET | Freq: Two times a day (BID) | ORAL | 3 refills | Status: DC

## 2019-04-10 NOTE — Lactation Note (Signed)
This note was copied from a baby's chart. Lactation Consultation Note  Patient Name: Cassidy Gutierrez Date: 04/10/2019 Reason for consult: Follow-up assessment  P1 mother whose infant is now 65 hours old.  This is an ETI at 37+6 weeks.  Family has received a discharge order.  Mother had no questions/concerns related to breast feeding at this time.  She plans to breast feed and supplement as needed until her milk comes to full supply.  Encouraged to pump every three hours after baby breast feeds.  Discussed the importance of continuing hand expression to help increase milk supply.    Engorgement prevention/treatment reviewed.  Mother has a manual pump and a DEBP for home use.  She has breast shells and has asked about taking all her supplies home with her.  Mother has our OP phone number for questions after discharge.  Father present and supportive.   Maternal Data    Feeding    LATCH Score                   Interventions    Lactation Tools Discussed/Used     Consult Status Consult Status: Complete Date: 04/10/19 Follow-up type: Call as needed    Cassidy Gutierrez 04/10/2019, 10:57 AM

## 2019-04-10 NOTE — Discharge Instructions (Signed)

## 2019-04-12 ENCOUNTER — Telehealth: Payer: Medicaid Other | Admitting: Advanced Practice Midwife

## 2019-04-18 ENCOUNTER — Ambulatory Visit: Payer: Medicaid Other

## 2019-05-09 ENCOUNTER — Encounter (INDEPENDENT_AMBULATORY_CARE_PROVIDER_SITE_OTHER): Payer: Medicaid Other | Admitting: Obstetrics and Gynecology

## 2019-05-09 NOTE — Progress Notes (Signed)
@  853am no answer will attempt in 5 mins.advised if I dont reach her to call our office back to reschedule. @900am  no answer will attempt in 5 mins. @907am  no answer lvm to cal to reschedule

## 2019-05-09 NOTE — Progress Notes (Signed)
Patient did not keep her appointment today.  Lezlie Lye, NP 05/09/2019 2:29 PM

## 2019-10-27 ENCOUNTER — Ambulatory Visit: Payer: Medicaid Other | Admitting: Obstetrics and Gynecology

## 2020-01-03 ENCOUNTER — Other Ambulatory Visit: Payer: Self-pay

## 2020-01-03 ENCOUNTER — Encounter (HOSPITAL_COMMUNITY): Payer: Self-pay

## 2020-01-03 DIAGNOSIS — R519 Headache, unspecified: Secondary | ICD-10-CM | POA: Insufficient documentation

## 2020-01-03 DIAGNOSIS — R55 Syncope and collapse: Secondary | ICD-10-CM | POA: Diagnosis present

## 2020-01-03 DIAGNOSIS — R42 Dizziness and giddiness: Secondary | ICD-10-CM | POA: Diagnosis not present

## 2020-01-03 DIAGNOSIS — Z5321 Procedure and treatment not carried out due to patient leaving prior to being seen by health care provider: Secondary | ICD-10-CM | POA: Insufficient documentation

## 2020-01-03 LAB — BASIC METABOLIC PANEL
Anion gap: 7 (ref 5–15)
BUN: 11 mg/dL (ref 6–20)
CO2: 26 mmol/L (ref 22–32)
Calcium: 8.7 mg/dL — ABNORMAL LOW (ref 8.9–10.3)
Chloride: 104 mmol/L (ref 98–111)
Creatinine, Ser: 0.67 mg/dL (ref 0.44–1.00)
GFR calc Af Amer: >60 mL/min (ref >60)
GFR calc non Af Amer: >60 mL/min (ref >60)
Glucose, Bld: 99 mg/dL (ref 70–99)
Potassium: 3.7 mmol/L (ref 3.5–5.1)
Sodium: 137 mmol/L (ref 135–145)

## 2020-01-03 LAB — CBC
HCT: 33.6 % — ABNORMAL LOW (ref 36.0–46.0)
Hemoglobin: 10.5 g/dL — ABNORMAL LOW (ref 12.0–15.0)
MCH: 22.8 pg — ABNORMAL LOW (ref 26.0–34.0)
MCHC: 31.3 g/dL (ref 30.0–36.0)
MCV: 72.9 fL — ABNORMAL LOW (ref 80.0–100.0)
Platelets: 262 10*3/uL (ref 150–400)
RBC: 4.61 MIL/uL (ref 3.87–5.11)
RDW: 17.2 % — ABNORMAL HIGH (ref 11.5–15.5)
WBC: 8.2 10*3/uL (ref 4.0–10.5)
nRBC: 0 % (ref 0.0–0.2)

## 2020-01-03 LAB — I-STAT BETA HCG BLOOD, ED (MC, WL, AP ONLY): I-stat hCG, quantitative: <5 m[IU]/mL (ref <5)

## 2020-01-03 LAB — CBG MONITORING, ED: Glucose-Capillary: 95 mg/dL (ref 70–99)

## 2020-01-03 NOTE — ED Triage Notes (Signed)
Pt reports dizziness and near syncope x 3 days. No LOC. Denies pain, but reports intermittent headaches. Denies vaginal bleeding.

## 2020-01-04 ENCOUNTER — Emergency Department (HOSPITAL_COMMUNITY)
Admission: EM | Admit: 2020-01-04 | Discharge: 2020-01-04 | Disposition: A | Discharge status: Home / Self Care | Payer: Medicaid Other | Attending: Emergency Medicine | Admitting: Emergency Medicine

## 2020-01-04 NOTE — ED Notes (Signed)
Called for vitals no answer °

## 2020-01-18 ENCOUNTER — Ambulatory Visit: Payer: Medicaid Other | Admitting: Dietician

## 2020-06-17 ENCOUNTER — Other Ambulatory Visit: Payer: Self-pay

## 2020-06-17 ENCOUNTER — Encounter (HOSPITAL_BASED_OUTPATIENT_CLINIC_OR_DEPARTMENT_OTHER): Payer: Self-pay | Admitting: *Deleted

## 2020-06-17 ENCOUNTER — Emergency Department (HOSPITAL_BASED_OUTPATIENT_CLINIC_OR_DEPARTMENT_OTHER)
Admission: EM | Admit: 2020-06-17 | Discharge: 2020-06-18 | Disposition: A | Discharge status: Home / Self Care | Payer: Medicaid Other | Attending: Emergency Medicine | Admitting: Emergency Medicine

## 2020-06-17 DIAGNOSIS — R42 Dizziness and giddiness: Secondary | ICD-10-CM | POA: Diagnosis present

## 2020-06-17 DIAGNOSIS — D509 Iron deficiency anemia, unspecified: Secondary | ICD-10-CM | POA: Diagnosis not present

## 2020-06-17 DIAGNOSIS — E876 Hypokalemia: Secondary | ICD-10-CM | POA: Diagnosis not present

## 2020-06-17 DIAGNOSIS — R200 Anesthesia of skin: Secondary | ICD-10-CM | POA: Insufficient documentation

## 2020-06-17 DIAGNOSIS — Z87891 Personal history of nicotine dependence: Secondary | ICD-10-CM | POA: Diagnosis not present

## 2020-06-17 LAB — BASIC METABOLIC PANEL
Anion gap: 9 (ref 5–15)
BUN: 8 mg/dL (ref 6–20)
CO2: 23 mmol/L (ref 22–32)
Calcium: 8.8 mg/dL — ABNORMAL LOW (ref 8.9–10.3)
Chloride: 103 mmol/L (ref 98–111)
Creatinine, Ser: 0.71 mg/dL (ref 0.44–1.00)
GFR, Estimated: >60 mL/min (ref >60)
Glucose, Bld: 114 mg/dL — ABNORMAL HIGH (ref 70–99)
Potassium: 3.3 mmol/L — ABNORMAL LOW (ref 3.5–5.1)
Sodium: 135 mmol/L (ref 135–145)

## 2020-06-17 LAB — CBC
HCT: 35.2 % — ABNORMAL LOW (ref 36.0–46.0)
Hemoglobin: 11.3 g/dL — ABNORMAL LOW (ref 12.0–15.0)
MCH: 23 pg — ABNORMAL LOW (ref 26.0–34.0)
MCHC: 32.1 g/dL (ref 30.0–36.0)
MCV: 71.7 fL — ABNORMAL LOW (ref 80.0–100.0)
Platelets: 233 10*3/uL (ref 150–400)
RBC: 4.91 MIL/uL (ref 3.87–5.11)
RDW: 17 % — ABNORMAL HIGH (ref 11.5–15.5)
WBC: 7.2 10*3/uL (ref 4.0–10.5)
nRBC: 0 % (ref 0.0–0.2)

## 2020-06-17 NOTE — ED Triage Notes (Signed)
Pt reports intermittent weakness, bilateral hand tingling and dizziness x several months. Pt has seen her PCP and was dx with low iron, pt completed one 'round' of iron pills but then did not f/u with PCP to continue iron.

## 2020-06-18 LAB — URINALYSIS, ROUTINE W REFLEX MICROSCOPIC
Bilirubin Urine: NEGATIVE
Glucose, UA: NEGATIVE mg/dL
Hgb urine dipstick: NEGATIVE
Ketones, ur: NEGATIVE mg/dL
Nitrite: NEGATIVE
Protein, ur: NEGATIVE mg/dL
Specific Gravity, Urine: 1.005 (ref 1.005–1.030)
pH: 5 (ref 5.0–8.0)

## 2020-06-18 LAB — PREGNANCY, URINE: Preg Test, Ur: NEGATIVE

## 2020-06-18 LAB — URINALYSIS, MICROSCOPIC (REFLEX): RBC / HPF: NONE SEEN RBC/hpf (ref 0–5)

## 2020-06-18 MED ORDER — FERROUS SULFATE 325 (65 FE) MG PO TABS: 325.0000 mg | ORAL_TABLET | Freq: Three times a day (TID) | ORAL | 2 refills | Status: AC

## 2020-06-18 MED ORDER — POTASSIUM CHLORIDE CRYS ER 20 MEQ PO TBCR
40.0000 meq | EXTENDED_RELEASE_TABLET | Freq: Once | ORAL | Status: AC
  Administered 2020-06-18: 40 meq via ORAL
  Filled 2020-06-18: qty 2

## 2020-06-18 NOTE — ED Notes (Signed)
Having dizziness, feeling light headed, appetite is poor. Having symptoms for the past month. Has been taking Iron tabs, completed one month of medication, has not followed up with prescribing MD per pt statement

## 2020-06-18 NOTE — ED Provider Notes (Signed)
[facility] Provider Note: Georgena Spurling, MD, FACEP  CSN: [id number] MRN: [id number] ARRIVAL: 06/17/20 at [place]: [place]  06/18/20 12:33 AM Cassidy Gutierrez is a 30 y.o. female with several weeks of dizziness by which she means lightheadedness and feeling "woozy".  She is also had generalized weakness and occasional tingling in her hands.  She is also had a desire to eat ice.  She was diagnosed with iron deficiency several months ago by her PCP and was placed on iron supplementation.  She finished that prescription but did not return for her PCP for follow-up.  She does acknowledge heavy periods.  She is not having any pain.   Past Medical History:  Diagnosis Date  . BV (bacterial vaginosis)     Past Surgical History:  Procedure Laterality Date  . NO PAST SURGERIES      Family History  Problem Relation Age of Onset  . Breast cancer Mother   . Diabetes Mother     Social History   Tobacco Use  . Smoking status: Former Research scientist (life sciences)  . Smokeless tobacco: Never Used  . Tobacco comment: hookah  Vaping Use  . Vaping Use: Never used  Substance Use Topics  . Alcohol use: Not Currently    Comment: not during pregnancy  . Drug use: Not Currently    Prior to Admission medications   Medication Sig Start Date End Date Taking? Authorizing Provider  Blood Pressure Monitoring (BLOOD PRESSURE KIT) DEVI 1 Device by Does not apply route as needed. ICD 10 Z 12/19/18   Constant, Peggy, MD  calcium carbonate (TUMS - DOSED IN MG ELEMENTAL CALCIUM) 500 MG chewable tablet Chew 1-2 tablets by mouth as needed for indigestion or heartburn.    [provider]  ferrous sulfate 325 (65 FE) MG tablet Take 1 tablet (325 mg total) by mouth 3 (three) times daily with meals. 06/18/20   Hester Joslin, Jenny Reichmann, MD    Allergies Patient has no known allergies.   REVIEW OF SYSTEMS  Negative except as noted here or in the History of  Present Illness.   PHYSICAL EXAMINATION  Initial Vital Signs Blood pressure (!) 143/90, pulse 76, temperature 98.2 F (36.8 C), temperature source Oral, resp. rate 20, height '5\' 5"'  (1.651 m), weight 104.3 kg, last menstrual period 06/10/2020, SpO2 100 %, unknown if currently breastfeeding.  Examination General: Well-developed, well-nourished female in no acute distress; appearance consistent with age of record HENT: normocephalic; atraumatic Eyes: pupils equal, round and reactive to light; extraocular muscles intact Neck: supple Heart: regular rate and rhythm Lungs: clear to auscultation bilaterally Abdomen: soft; nondistended; nontender; bowel sounds present Extremities: No deformity; full range of motion; pulses normal Neurologic: Awake, alert and oriented; motor function intact in all extremities and symmetric; no facial droop Skin: Warm and dry Psychiatric: Normal mood and affect   RESULTS  Summary of this visit's results, reviewed and interpreted by myself:   EKG Interpretation  Date/Time:    Ventricular Rate:    PR Interval:    QRS Duration:   QT Interval:    QTC Calculation:   R Axis:     Text Interpretation:        Laboratory Studies: Results for orders placed or performed during the hospital encounter of 06/17/20 (from the past 24 hour(s))  Basic metabolic panel     Status: Abnormal   Collection Time: 06/17/20  7:28 PM  Result Value Ref Range  Sodium 135 135 - 145 mmol/L   Potassium 3.3 (L) 3.5 - 5.1 mmol/L   Chloride 103 98 - 111 mmol/L   CO2 23 22 - 32 mmol/L   Glucose, Bld 114 (H) 70 - 99 mg/dL   BUN 8 6 - 20 mg/dL   Creatinine, Ser 0.71 0.44 - 1.00 mg/dL   Calcium 8.8 (L) 8.9 - 10.3 mg/dL   GFR, Estimated >60 >60 mL/min   Anion gap 9 5 - 15  CBC     Status: Abnormal   Collection Time: 06/17/20  7:28 PM  Result Value Ref Range   WBC 7.2 4.0 - 10.5 K/uL   RBC 4.91 3.87 - 5.11 MIL/uL   Hemoglobin 11.3 (L) 12.0 - 15.0 g/dL   HCT 35.2 (L) 36.0 -  46.0 %   MCV 71.7 (L) 80.0 - 100.0 fL   MCH 23.0 (L) 26.0 - 34.0 pg   MCHC 32.1 30.0 - 36.0 g/dL   RDW 17.0 (H) 11.5 - 15.5 %   Platelets 233 150 - 400 K/uL   nRBC 0.0 0.0 - 0.2 %  Urinalysis, Routine w reflex microscopic     Status: Abnormal   Collection Time: 06/17/20 11:59 PM  Result Value Ref Range   Color, Urine STRAW (A) YELLOW   APPearance CLOUDY (A) CLEAR   Specific Gravity, Urine 1.005 1.005 - 1.030   pH 5.0 5.0 - 8.0   Glucose, UA NEGATIVE NEGATIVE mg/dL   Hgb urine dipstick NEGATIVE NEGATIVE   Bilirubin Urine NEGATIVE NEGATIVE   Ketones, ur NEGATIVE NEGATIVE mg/dL   Protein, ur NEGATIVE NEGATIVE mg/dL   Nitrite NEGATIVE NEGATIVE   Leukocytes,Ua SMALL (A) NEGATIVE  Pregnancy, urine     Status: None   Collection Time: 06/17/20 11:59 PM  Result Value Ref Range   Preg Test, Ur NEGATIVE NEGATIVE  Urinalysis, Microscopic (reflex)     Status: Abnormal   Collection Time: 06/17/20 11:59 PM  Result Value Ref Range   RBC / HPF NONE SEEN 0 - 5 RBC/hpf   WBC, UA 0-5 0 - 5 WBC/hpf   Bacteria, UA MANY (A) NONE SEEN   Squamous Epithelial / LPF 0-5 0 - 5   Imaging Studies: No results found.  ED COURSE and MDM  Nursing notes, initial and subsequent vitals signs, including pulse oximetry, reviewed and interpreted by myself.  Vitals:   06/17/20 1925 06/17/20 1926 06/17/20 2220 06/18/20 0005  BP: (!) 147/99  (!) 145/100 (!) 143/90  Pulse: 85  88 76  Resp: '18  16 20  ' Temp: 98.2 F (36.8 C)     TempSrc: Oral     SpO2: 100%  100% 100%  Weight:  104.3 kg    Height:  '5\' 5"'  (1.651 m)     Medications  potassium chloride SA (KLOR-CON) CR tablet 40 mEq (has no administration in time range)    Symptoms likely due to iron deficiency.  Will place patient back on iron supplements and refer her back to her PCP  PROCEDURES  Procedures   ED DIAGNOSES     ICD-10-CM   1. Microcytic anemia  D50.9   2. Hypokalemia  E87.6        Clark Cuff, Jenny Reichmann, MD 06/18/20 [phone]

## 2020-08-28 ENCOUNTER — Encounter: Payer: Self-pay | Admitting: *Deleted

## 2020-10-21 ENCOUNTER — Encounter: Payer: Medicaid Other | Admitting: Medical
